# Patient Record
Sex: Male | Born: 1985 | Race: White | Hispanic: No | Marital: Married | Smoking: Current every day smoker
Health system: Southern US, Community
[De-identification: ages and names within clinical notes are randomized; demographics above are authoritative.]

## PROBLEM LIST (undated history)

## (undated) DIAGNOSIS — I10 Essential (primary) hypertension: Secondary | ICD-10-CM

## (undated) HISTORY — PX: APPENDECTOMY: SHX54

## (undated) HISTORY — PX: WISDOM TOOTH EXTRACTION: SHX21

---

## 2014-11-12 ENCOUNTER — Encounter (HOSPITAL_COMMUNITY): Payer: Self-pay | Admitting: Vascular Surgery

## 2014-11-12 ENCOUNTER — Emergency Department (HOSPITAL_COMMUNITY)
Admission: EM | Admit: 2014-11-12 | Discharge: 2014-11-12 | Disposition: A | Discharge status: Home / Self Care | Payer: BLUE CROSS/BLUE SHIELD | Attending: Emergency Medicine | Admitting: Emergency Medicine

## 2014-11-12 ENCOUNTER — Emergency Department (HOSPITAL_COMMUNITY): Payer: BLUE CROSS/BLUE SHIELD

## 2014-11-12 DIAGNOSIS — R079 Chest pain, unspecified: Secondary | ICD-10-CM | POA: Diagnosis present

## 2014-11-12 DIAGNOSIS — Z720 Tobacco use: Secondary | ICD-10-CM | POA: Diagnosis not present

## 2014-11-12 DIAGNOSIS — R0789 Other chest pain: Secondary | ICD-10-CM | POA: Diagnosis not present

## 2014-11-12 DIAGNOSIS — I10 Essential (primary) hypertension: Secondary | ICD-10-CM | POA: Diagnosis not present

## 2014-11-12 HISTORY — DX: Essential (primary) hypertension: I10

## 2014-11-12 LAB — CBC
HEMATOCRIT: 43.2 % (ref 39.0–52.0)
HEMOGLOBIN: 14.6 g/dL (ref 13.0–17.0)
MCH: 29.4 pg (ref 26.0–34.0)
MCHC: 33.8 g/dL (ref 30.0–36.0)
MCV: 86.9 fL (ref 78.0–100.0)
Platelets: 205 10*3/uL (ref 150–400)
RBC: 4.97 MIL/uL (ref 4.22–5.81)
RDW: 13.4 % (ref 11.5–15.5)
WBC: 8.3 10*3/uL (ref 4.0–10.5)

## 2014-11-12 LAB — BASIC METABOLIC PANEL
ANION GAP: 8 (ref 5–15)
BUN: 7 mg/dL (ref 6–20)
CHLORIDE: 107 mmol/L (ref 101–111)
CO2: 26 mmol/L (ref 22–32)
Calcium: 9.4 mg/dL (ref 8.9–10.3)
Creatinine, Ser: 1 mg/dL (ref 0.61–1.24)
GFR calc non Af Amer: >60 mL/min (ref >60)
GLUCOSE: 117 mg/dL — AB (ref 65–99)
Potassium: 4 mmol/L (ref 3.5–5.1)
Sodium: 141 mmol/L (ref 135–145)

## 2014-11-12 LAB — I-STAT TROPONIN, ED
Troponin i, poc: 0 ng/mL (ref 0.00–0.08)
Troponin i, poc: 0 ng/mL (ref 0.00–0.08)

## 2014-11-12 NOTE — ED Provider Notes (Signed)
CSN: [id number]     Arrival date & time 11/12/14  1240 History  This chart was scribed for Eyvonne Mechanic, PA-C, working with Vanetta Mulders, MD by Elon Spanner, ED Scribe. This patient was seen in room TR06C/TR06C and the patient's care was started at 5:24 PM.   Chief Complaint  Patient presents with  . Chest Pain   The history is provided by the patient. No language interpreter was used.   HPI Comments: Jason Frank is a 29 y.o. male who presents to the Emergency Department complaining of sharp/pressure, non-radiating left-sided CP only with deep inspiration, cough, and movement onset two days ago with no precipitating event.  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  He has taken Aleve without relief. No SOB.  Patient is a current smoker.  He denies recent surgery, prolonged immobilzation or hx of HTN, HLD, CA.   There is a family hx of MI in father (4 total, the first at age 53).  He denies new cough, fever, rhinorrhea, eye watering.   Pt believes he pulled a muscle in his chest and would like work not so that he can rest.   PCP: Dr. Dimas Aguas  Past Medical History  Diagnosis Date  . Hypertension    Past Surgical History  Procedure Laterality Date  . Appendectomy    . Wisdom tooth extraction     No family history on file. Social History  Substance Use Topics  . Smoking status: Current Some Day Smoker -- 1.00 packs/day    Types: Cigarettes  . Smokeless tobacco: Current User    Types: Chew  . Alcohol Use: Yes     Comment: occasioanlly    Review of Systems  All other systems reviewed and are negative.  Allergies  Review of patient's allergies indicates not on file.  Home Medications   Prior to Admission medications   Not on File   BP 132/82 mmHg  Pulse 79  Temp(Src) 98.2 F (36.8 C) (Oral)  Resp 12  SpO2 100% Physical Exam  Constitutional: He is oriented to person, place, and time. He appears well-developed and well-nourished. No  distress.  HENT:  Head: Normocephalic and atraumatic.  Eyes: Conjunctivae and EOM are normal.  Neck: Neck supple. No tracheal deviation present.  Cardiovascular: Normal rate, regular rhythm, normal heart sounds and intact distal pulses.  Exam reveals no gallop and no friction rub.   No murmur heard. Pulmonary/Chest: Effort normal. No respiratory distress.  Lungs CTA.   Exquisitely tender over left superior portion of pectoralis major.  No signs of trauma, bruising.  No rash.   Musculoskeletal: Normal range of motion. He exhibits no edema or tenderness.  No LE swelling or edema  Neurological: He is alert and oriented to person, place, and time.  Skin: Skin is warm and dry.  Psychiatric: He has a normal mood and affect. His behavior is normal.  Nursing note and vitals reviewed.   ED Course  Procedures (including critical care time)  DIAGNOSTIC STUDIES: Oxygen Saturation is 98% on RA, normal by my interpretation.    COORDINATION OF CARE:  5:36 PM Patient should use ibuprofen and f/u with PCP if no improvement observed.  Patient acknowledges and agrees with plan.    Labs Review Labs Reviewed  BASIC METABOLIC PANEL - Abnormal; Notable for the following:    Glucose, Bld 117 (*)    All other components within normal limits  CBC  I-STAT TROPOININ, ED  Rosezena Sensor, ED  Imaging Review No results found. I have personally reviewed and evaluated these images and lab results as part of my medical decision-making.   EKG Interpretation   Date/Time:  Monday November 12 2014 12:46:44 EDT Ventricular Rate:  95 PR Interval:  152 QRS Duration: 98 QT Interval:  332 QTC Calculation: 417 R Axis:   94 Text Interpretation:  Normal sinus rhythm Rightward axis Incomplete right  bundle branch block Borderline ECG No old tracing to compare Confirmed by  Rhunette Croft, MD, Janey Genta [phone]) on 11/12/2014 3:04:41 PM      MDM   Final diagnoses:  Chest wall tenderness      Labs: I-stat  Troponin (x2), CBC, Basic Metabolic Panel  Imaging: DG Chest 2 View  Therapeutics: none  Assessment/Plan: Pt's presentation likely represents muscular or costochondritis pain. Perc negative, ACS hear score 1; highly unlikely to be PE/ACS. Chest x ray shows no signs of pneumonia. No UR symptoms. Pt would like work note until his chest pain improves. No findings on exam that would indicate significant injury. He will be given work note for tonight and instructed to follow-up with PCP for further evaluation and management. Strict return precautions given.   Discharge Meds: Ibuprofen   I personally performed the services described in this documentation, which was scribed in my presence. The recorded information has been reviewed and is accurate.   Eyvonne Mechanic, PA-C 11/14/14 1426  Vanetta Mulders, MD 11/16/14 [phone]

## 2014-11-12 NOTE — ED Notes (Signed)
Pt reports to the ED for eval of left sided CP x several days. Reports the pain is worse with deep inspiration, movement, and cough. He reports associated symptoms of SOB and dizziness with position changes. Pt denies any injury to chest or cough. Describes the pain as sharp in nature. It does not radiate. Pt A&Ox4, resp e/u, and skin warm and dry.

## 2014-11-12 NOTE — Discharge Instructions (Signed)
Chest Wall Pain Chest wall pain is pain in or around the bones and muscles of your chest. It may take up to 6 weeks to get better. It may take longer if you must stay physically active in your work and activities.  CAUSES  Chest wall pain may happen on its own. However, it may be caused by:  A viral illness like the flu.  Injury.  Coughing.  Exercise.  Arthritis.  Fibromyalgia.  Shingles. HOME CARE INSTRUCTIONS   Avoid overtiring physical activity. Try not to strain or perform activities that cause pain. This includes any activities using your chest or your abdominal and side muscles, especially if heavy weights are used.  Put ice on the sore area.  Put ice in a plastic bag.  Place a towel between your skin and the bag.  Leave the ice on for 15-20 minutes per hour while awake for the first 2 days.  Only take over-the-counter or prescription medicines for pain, discomfort, or fever as directed by your caregiver. SEEK IMMEDIATE MEDICAL CARE IF:   Your pain increases, or you are very uncomfortable.  You have a fever.  Your chest pain becomes worse.  You have new, unexplained symptoms.  You have nausea or vomiting.  You feel sweaty or lightheaded.  You have a cough with phlegm (sputum), or you cough up blood. MAKE SURE YOU:   Understand these instructions.  Will watch your condition.  Will get help right away if you are not doing well or get worse. Document Released: 03/09/2005 Document Revised: 06/01/2011 Document Reviewed: 11/03/2010 [facility] Patient Information 2015 [place], [place]. This information is not intended to replace advice given to you by your health care provider. Make sure you discuss any questions you have with your health care provider.  Please use ibuprofen, Tylenol, ice as needed for pain. Please contact your primary care provider for reevaluation of symptoms continue to persist.

## 2014-11-12 NOTE — ED Notes (Signed)
Pt is in stable condition upon d/c and ambulates from ED. 

## 2015-07-24 DIAGNOSIS — Z9852 Vasectomy status: Secondary | ICD-10-CM | POA: Diagnosis not present

## 2015-07-24 DIAGNOSIS — Z302 Encounter for sterilization: Secondary | ICD-10-CM | POA: Diagnosis not present

## 2015-08-07 DIAGNOSIS — Z9852 Vasectomy status: Secondary | ICD-10-CM | POA: Diagnosis not present

## 2015-08-07 DIAGNOSIS — K600 Acute anal fissure: Secondary | ICD-10-CM | POA: Diagnosis not present

## 2015-08-07 DIAGNOSIS — Z6827 Body mass index (BMI) 27.0-27.9, adult: Secondary | ICD-10-CM | POA: Diagnosis not present

## 2016-02-05 DIAGNOSIS — F17200 Nicotine dependence, unspecified, uncomplicated: Secondary | ICD-10-CM | POA: Diagnosis not present

## 2016-02-05 DIAGNOSIS — I10 Essential (primary) hypertension: Secondary | ICD-10-CM | POA: Diagnosis not present

## 2016-02-05 DIAGNOSIS — Z6828 Body mass index (BMI) 28.0-28.9, adult: Secondary | ICD-10-CM | POA: Diagnosis not present

## 2016-02-17 DIAGNOSIS — F17200 Nicotine dependence, unspecified, uncomplicated: Secondary | ICD-10-CM | POA: Diagnosis not present

## 2016-02-17 DIAGNOSIS — I10 Essential (primary) hypertension: Secondary | ICD-10-CM | POA: Diagnosis not present

## 2016-02-26 DIAGNOSIS — I10 Essential (primary) hypertension: Secondary | ICD-10-CM | POA: Diagnosis not present

## 2016-02-26 DIAGNOSIS — K219 Gastro-esophageal reflux disease without esophagitis: Secondary | ICD-10-CM | POA: Diagnosis not present

## 2016-02-26 DIAGNOSIS — Z6827 Body mass index (BMI) 27.0-27.9, adult: Secondary | ICD-10-CM | POA: Diagnosis not present

## 2016-02-26 DIAGNOSIS — Z23 Encounter for immunization: Secondary | ICD-10-CM | POA: Diagnosis not present

## 2016-02-26 DIAGNOSIS — F17200 Nicotine dependence, unspecified, uncomplicated: Secondary | ICD-10-CM | POA: Diagnosis not present

## 2016-04-16 DIAGNOSIS — A084 Viral intestinal infection, unspecified: Secondary | ICD-10-CM | POA: Diagnosis not present

## 2016-04-16 DIAGNOSIS — Z6828 Body mass index (BMI) 28.0-28.9, adult: Secondary | ICD-10-CM | POA: Diagnosis not present

## 2017-03-02 DIAGNOSIS — Z6830 Body mass index (BMI) 30.0-30.9, adult: Secondary | ICD-10-CM | POA: Diagnosis not present

## 2017-03-02 DIAGNOSIS — J209 Acute bronchitis, unspecified: Secondary | ICD-10-CM | POA: Diagnosis not present

## 2017-04-22 DIAGNOSIS — S62639B Displaced fracture of distal phalanx of unspecified finger, initial encounter for open fracture: Secondary | ICD-10-CM | POA: Insufficient documentation

## 2017-06-21 DIAGNOSIS — Z6826 Body mass index (BMI) 26.0-26.9, adult: Secondary | ICD-10-CM | POA: Diagnosis not present

## 2017-06-21 DIAGNOSIS — K649 Unspecified hemorrhoids: Secondary | ICD-10-CM | POA: Diagnosis not present

## 2017-09-04 DIAGNOSIS — Z6830 Body mass index (BMI) 30.0-30.9, adult: Secondary | ICD-10-CM | POA: Diagnosis not present

## 2017-09-04 DIAGNOSIS — R197 Diarrhea, unspecified: Secondary | ICD-10-CM | POA: Diagnosis not present

## 2017-09-04 DIAGNOSIS — J019 Acute sinusitis, unspecified: Secondary | ICD-10-CM | POA: Diagnosis not present

## 2019-03-26 DIAGNOSIS — Z20822 Contact with and (suspected) exposure to covid-19: Secondary | ICD-10-CM | POA: Diagnosis not present

## 2019-03-26 DIAGNOSIS — R05 Cough: Secondary | ICD-10-CM | POA: Diagnosis not present

## 2019-03-26 DIAGNOSIS — R0989 Other specified symptoms and signs involving the circulatory and respiratory systems: Secondary | ICD-10-CM | POA: Diagnosis not present

## 2019-03-26 DIAGNOSIS — R0981 Nasal congestion: Secondary | ICD-10-CM | POA: Diagnosis not present

## 2019-06-13 ENCOUNTER — Ambulatory Visit (INDEPENDENT_AMBULATORY_CARE_PROVIDER_SITE_OTHER): Payer: BC Managed Care – PPO | Admitting: Sports Medicine

## 2019-06-13 ENCOUNTER — Ambulatory Visit (INDEPENDENT_AMBULATORY_CARE_PROVIDER_SITE_OTHER): Payer: BC Managed Care – PPO

## 2019-06-13 ENCOUNTER — Other Ambulatory Visit: Payer: Self-pay

## 2019-06-13 DIAGNOSIS — S6991XA Unspecified injury of right wrist, hand and finger(s), initial encounter: Secondary | ICD-10-CM

## 2019-06-13 DIAGNOSIS — S62021A Displaced fracture of middle third of navicular [scaphoid] bone of right wrist, initial encounter for closed fracture: Secondary | ICD-10-CM

## 2019-06-13 DIAGNOSIS — S62009A Unspecified fracture of navicular [scaphoid] bone of unspecified wrist, initial encounter for closed fracture: Secondary | ICD-10-CM | POA: Insufficient documentation

## 2019-06-13 DIAGNOSIS — R030 Elevated blood-pressure reading, without diagnosis of hypertension: Secondary | ICD-10-CM | POA: Diagnosis not present

## 2019-06-13 DIAGNOSIS — S62001A Unspecified fracture of navicular [scaphoid] bone of right wrist, initial encounter for closed fracture: Secondary | ICD-10-CM | POA: Diagnosis not present

## 2019-06-13 MED ORDER — MELOXICAM 15 MG PO TABS
ORAL_TABLET | ORAL | 3 refills | Status: DC
Start: 2019-06-13 — End: 2023-03-07

## 2019-06-13 NOTE — Assessment & Plan Note (Addendum)
Jason Frank had a fall onto an outstretched hand about 2 months ago, he had significant swelling and bruising at the time but never sought medical care. He has persistent pain, swelling and loss of range of motion. Pain in the anatomical snuffbox, and a positive Watson's test and a positive lunotriquetral shuck test. We are going to place him in a thumb spica brace, start meloxicam, and get some x-rays. Return in 2 weeks, if no better we will proceed with MR arthrogram. He will need to wear his brace while working for now.  X-rays do show a displaced scaphoid fracture. This is unfortunately going to need surgery.  I am going to go ahead and do a referral to Dr. Amanda Pea with orthopedic hand surgery, follow with me as needed.

## 2019-06-13 NOTE — Addendum Note (Signed)
Addended by: Monica Becton on: 06/13/2019 11:43 AM   Modules accepted: Orders

## 2019-06-13 NOTE — Progress Notes (Addendum)
    Procedures performed today:    None.  Independent interpretation of notes and tests performed by another provider:   None.  Impression and Recommendations:    Closed displaced fracture of scaphoid Jason Frank had a fall onto an outstretched hand about 2 months ago, he had significant swelling and bruising at the time but never sought medical care. He has persistent pain, swelling and loss of range of motion. Pain in the anatomical snuffbox, and a positive Watson's test and a positive lunotriquetral shuck test. We are going to place him in a thumb spica brace, start meloxicam, and get some x-rays. Return in 2 weeks, if no better we will proceed with MR arthrogram. He will need to wear his brace while working for now.  X-rays do show a displaced scaphoid fracture. This is unfortunately going to need surgery.  I am going to go ahead and do a referral to Dr. Amanda Pea with orthopedic hand surgery, follow with me as needed.  Elevated blood pressure reading No headaches or visual changes or chest pain. He does have a strong family history of MI. He agrees to seek primary care ASAP.    ___________________________________________ Jason Frank. Benjamin Stain, M.D., ABFM., CAQSM. Primary Care and Sports Medicine [facility]  Adjunct Instructor of Family Medicine  [employer]

## 2019-06-13 NOTE — Assessment & Plan Note (Signed)
No headaches or visual changes or chest pain. He does have a strong family history of MI. He agrees to seek primary care ASAP.

## 2019-06-16 DIAGNOSIS — S6291XA Unspecified fracture of right wrist and hand, initial encounter for closed fracture: Secondary | ICD-10-CM | POA: Diagnosis not present

## 2019-06-20 ENCOUNTER — Other Ambulatory Visit: Payer: Self-pay | Admitting: Orthopedic Surgery

## 2019-06-20 DIAGNOSIS — S6291XA Unspecified fracture of right wrist and hand, initial encounter for closed fracture: Secondary | ICD-10-CM

## 2019-06-27 ENCOUNTER — Ambulatory Visit: Payer: BC Managed Care – PPO | Admitting: Sports Medicine

## 2019-07-04 ENCOUNTER — Other Ambulatory Visit: Payer: Self-pay | Admitting: Orthopedic Surgery

## 2019-07-05 ENCOUNTER — Other Ambulatory Visit: Payer: Self-pay | Admitting: Orthopedic Surgery

## 2019-07-05 DIAGNOSIS — S6291XA Unspecified fracture of right wrist and hand, initial encounter for closed fracture: Secondary | ICD-10-CM

## 2019-07-06 ENCOUNTER — Other Ambulatory Visit: Payer: Self-pay

## 2019-07-06 ENCOUNTER — Ambulatory Visit
Admission: RE | Admit: 2019-07-06 | Discharge: 2019-07-06 | Disposition: A | Discharge status: Home / Self Care | Payer: BC Managed Care – PPO | Source: Ambulatory Visit | Attending: Orthopedic Surgery | Admitting: Orthopedic Surgery

## 2019-07-06 ENCOUNTER — Inpatient Hospital Stay: Admission: RE | Admit: 2019-07-06 | Payer: BC Managed Care – PPO | Source: Ambulatory Visit

## 2019-07-06 DIAGNOSIS — S6291XA Unspecified fracture of right wrist and hand, initial encounter for closed fracture: Secondary | ICD-10-CM

## 2019-07-06 DIAGNOSIS — S62001A Unspecified fracture of navicular [scaphoid] bone of right wrist, initial encounter for closed fracture: Secondary | ICD-10-CM | POA: Diagnosis not present

## 2019-07-06 DIAGNOSIS — S62184A Nondisplaced fracture of trapezoid [smaller multangular], right wrist, initial encounter for closed fracture: Secondary | ICD-10-CM | POA: Diagnosis not present

## 2019-07-11 DIAGNOSIS — S62001D Unspecified fracture of navicular [scaphoid] bone of right wrist, subsequent encounter for fracture with routine healing: Secondary | ICD-10-CM | POA: Diagnosis not present

## 2019-07-11 DIAGNOSIS — S6291XA Unspecified fracture of right wrist and hand, initial encounter for closed fracture: Secondary | ICD-10-CM | POA: Diagnosis not present

## 2019-07-17 DIAGNOSIS — S62021A Displaced fracture of middle third of navicular [scaphoid] bone of right wrist, initial encounter for closed fracture: Secondary | ICD-10-CM | POA: Diagnosis not present

## 2019-07-17 DIAGNOSIS — Y999 Unspecified external cause status: Secondary | ICD-10-CM | POA: Diagnosis not present

## 2019-07-17 DIAGNOSIS — X58XXXA Exposure to other specified factors, initial encounter: Secondary | ICD-10-CM | POA: Diagnosis not present

## 2019-07-17 DIAGNOSIS — S62001A Unspecified fracture of navicular [scaphoid] bone of right wrist, initial encounter for closed fracture: Secondary | ICD-10-CM | POA: Diagnosis not present

## 2019-08-01 DIAGNOSIS — S62021D Displaced fracture of middle third of navicular [scaphoid] bone of right wrist, subsequent encounter for fracture with routine healing: Secondary | ICD-10-CM | POA: Diagnosis not present

## 2019-08-22 DIAGNOSIS — S62021D Displaced fracture of middle third of navicular [scaphoid] bone of right wrist, subsequent encounter for fracture with routine healing: Secondary | ICD-10-CM | POA: Diagnosis not present

## 2019-09-12 DIAGNOSIS — S62021D Displaced fracture of middle third of navicular [scaphoid] bone of right wrist, subsequent encounter for fracture with routine healing: Secondary | ICD-10-CM | POA: Diagnosis not present

## 2019-10-03 DIAGNOSIS — S62021D Displaced fracture of middle third of navicular [scaphoid] bone of right wrist, subsequent encounter for fracture with routine healing: Secondary | ICD-10-CM | POA: Diagnosis not present

## 2020-03-24 DIAGNOSIS — U071 COVID-19: Secondary | ICD-10-CM | POA: Diagnosis not present

## 2020-03-24 DIAGNOSIS — Z20822 Contact with and (suspected) exposure to covid-19: Secondary | ICD-10-CM | POA: Diagnosis not present

## 2021-03-03 DIAGNOSIS — U071 COVID-19: Secondary | ICD-10-CM | POA: Diagnosis not present

## 2021-03-03 DIAGNOSIS — Z20822 Contact with and (suspected) exposure to covid-19: Secondary | ICD-10-CM | POA: Diagnosis not present

## 2021-03-03 DIAGNOSIS — M791 Myalgia, unspecified site: Secondary | ICD-10-CM | POA: Diagnosis not present

## 2021-08-22 DIAGNOSIS — B349 Viral infection, unspecified: Secondary | ICD-10-CM | POA: Diagnosis not present

## 2021-08-22 DIAGNOSIS — M791 Myalgia, unspecified site: Secondary | ICD-10-CM | POA: Diagnosis not present

## 2021-11-16 IMAGING — DX DG WRIST COMPLETE 3+V*R*
4 series · 4 of 4 positions shown · non-contrast
Comparison: None.

CLINICAL DATA: Fell 2 months ago, right wrist injury, pain and
swelling, pain in anatomic snuff box

EXAM:
RIGHT WRIST - COMPLETE 3+ VIEW

[wrist pa]
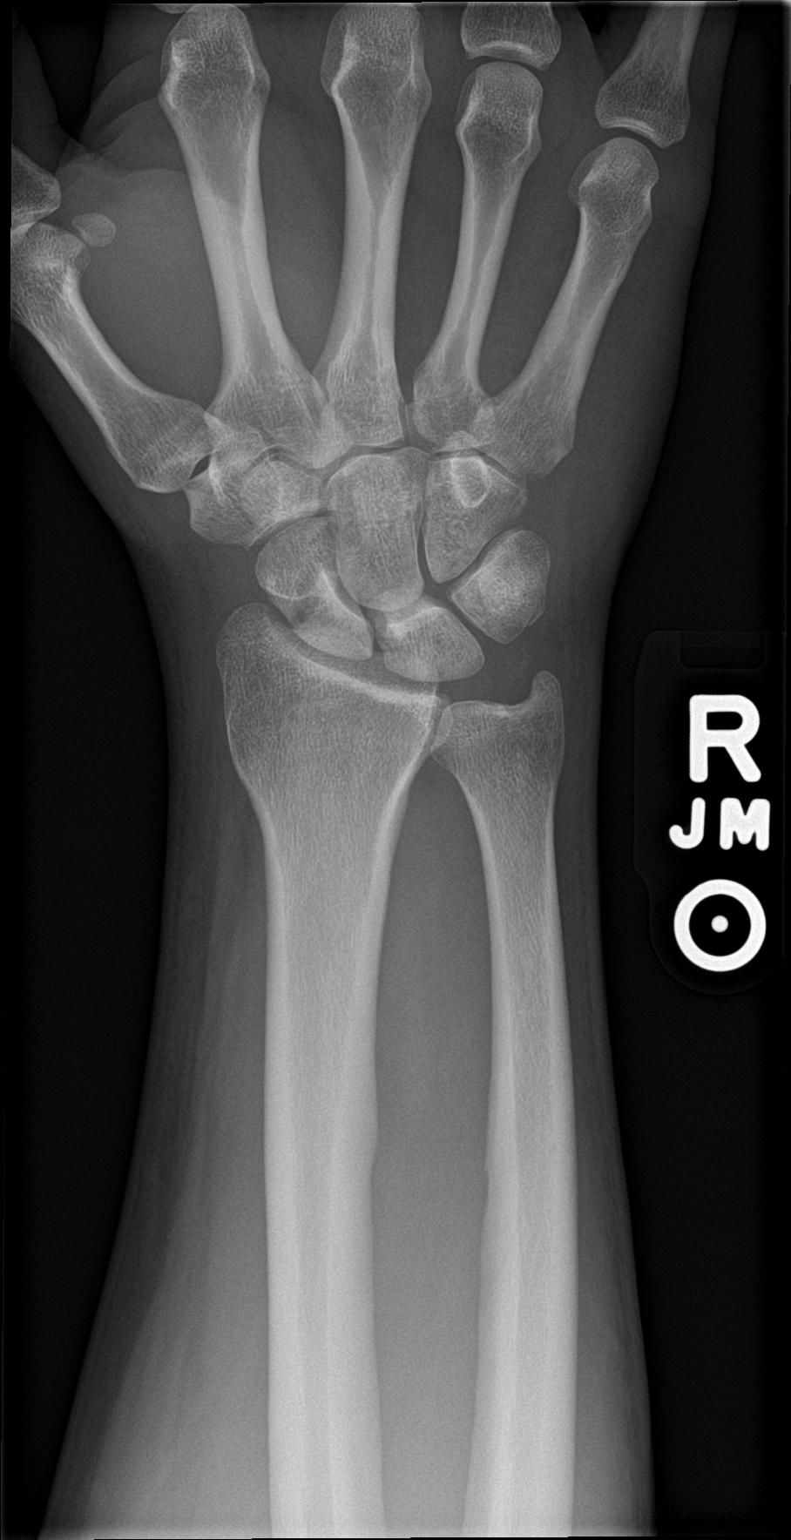

[wrist obl]
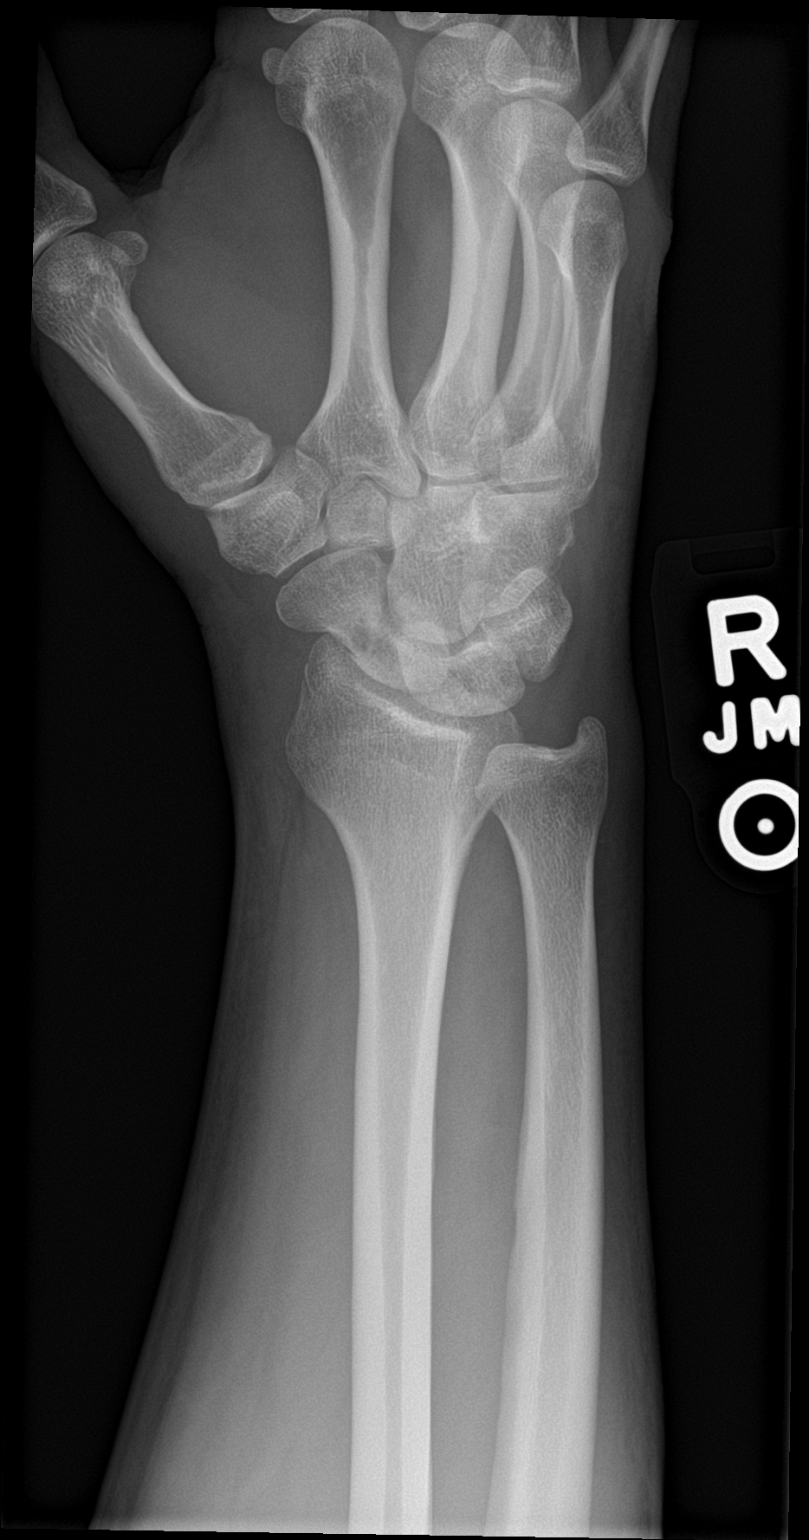

[wrist lat]
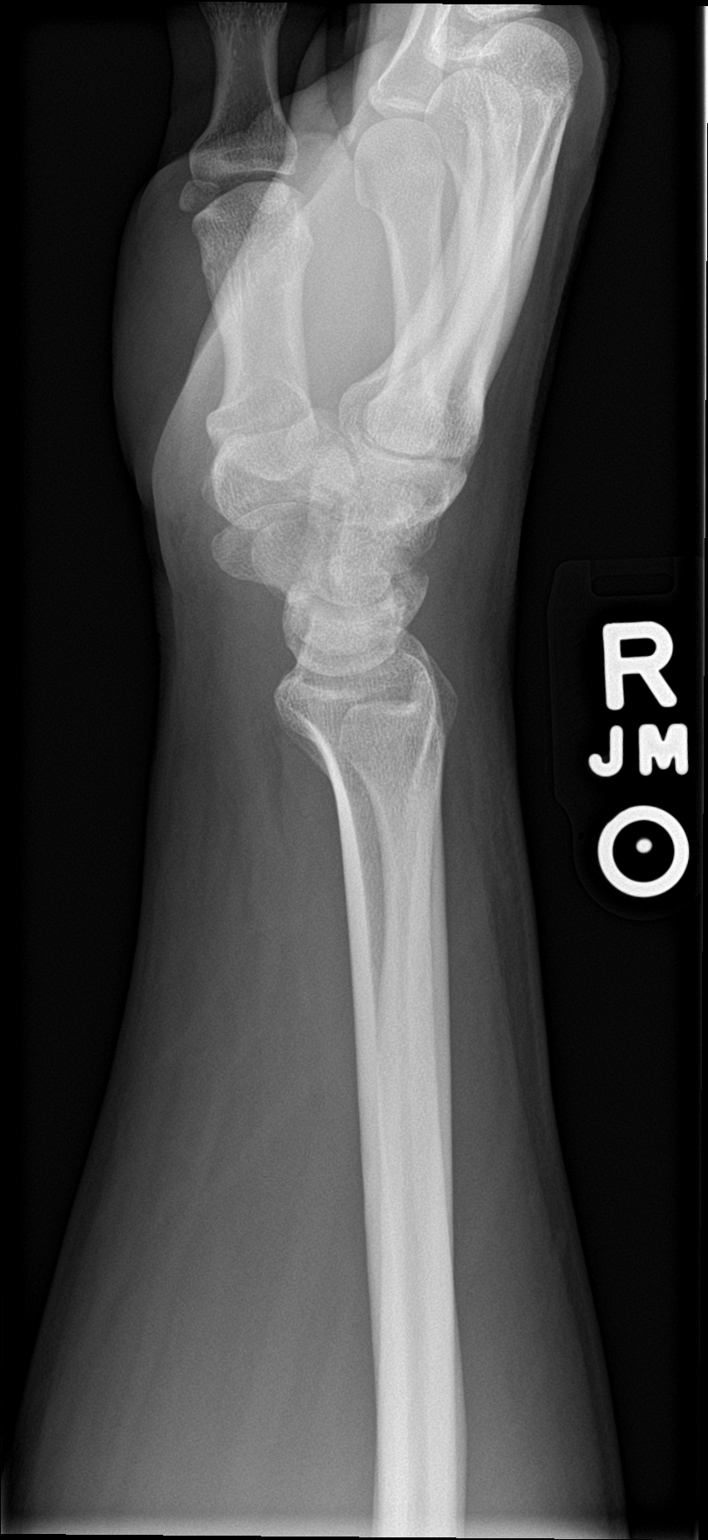

[wrist navicular]
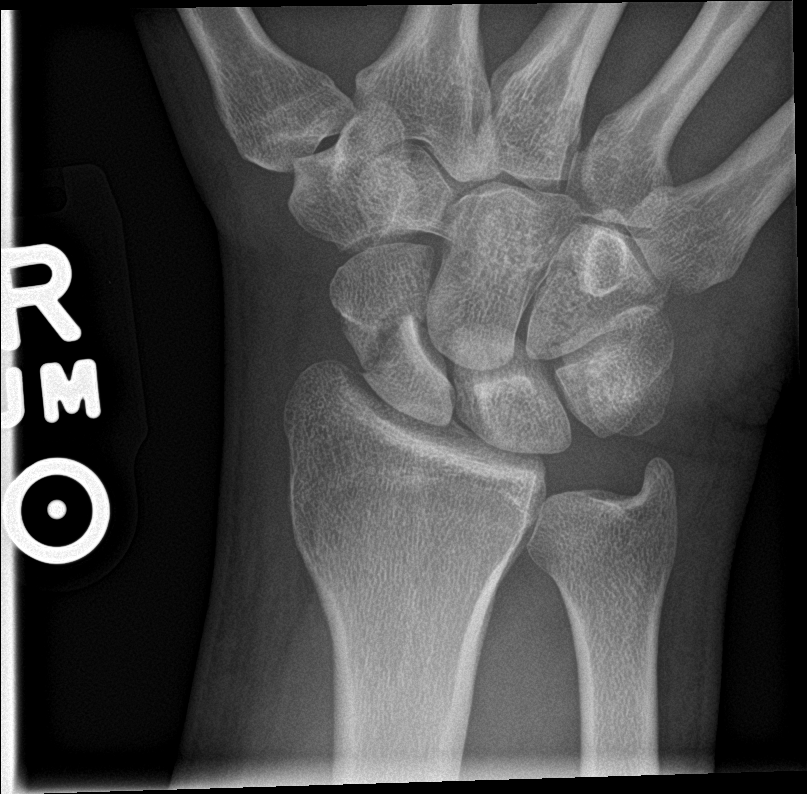

[4 of 4 positions shown; findings below may reference images not displayed]

FINDINGS: Frontal, oblique, lateral, and ulnar deviated views of the right
wrist are obtained. There is a scaphoid waist fracture which is
minimally displaced. No significant callus formation.

No other acute bony abnormalities. Slight negative ulnar variance
incidentally noted. Soft tissues are normal.
IMPRESSION: 1. Minimally displaced scaphoid waist fracture. No significant
callus formation. Orthopedic follow-up recommended.

## 2021-12-09 IMAGING — CT CT WRIST*R* W/O CM
3 of 6 series · 9 of 36 positions shown, 10 images · non-contrast
Comparison: X-ray 06/13/2019

CLINICAL DATA: Scaphoid fracture. Fall 3 months ago

EXAM:
CT OF THE RIGHT WRIST WITHOUT CONTRAST
3-DIMENSIONAL CT IMAGE RENDERING ON INDEPENDENT WORKSTATION
TECHNIQUE: Multidetector CT imaging of the right wrist was performed according
to the standard protocol. Multiplanar CT image reconstructions were
also generated.3-dimensional CT images were rendered by
post-processing of the original CT data on an independent
workstation. The 3-dimensional CT images were interpreted and
findings were reported in the accompanying complete CT report for
this study

[Series 3: wrist 1.50 br60 s3 axial bone hd fov · axial · 0.16mm/px · z∈[-689,-631]mm · 2 of 223 slices shown, 3 images]
[im 75/223  soft-tissue]
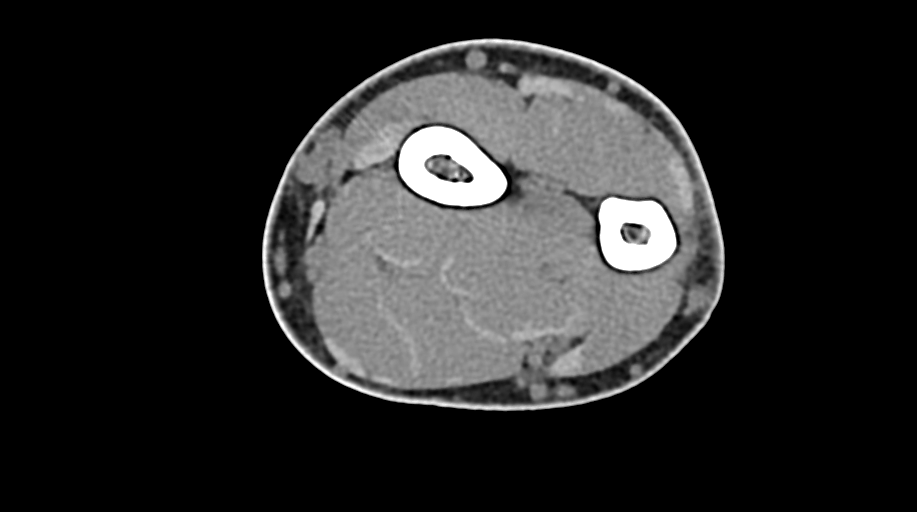
[im 75/223  bone]
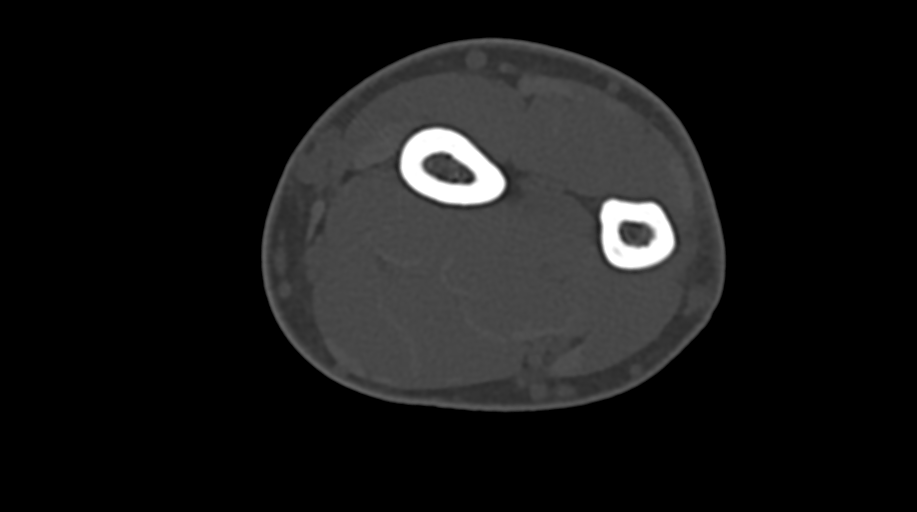
[im 149/223  bone]
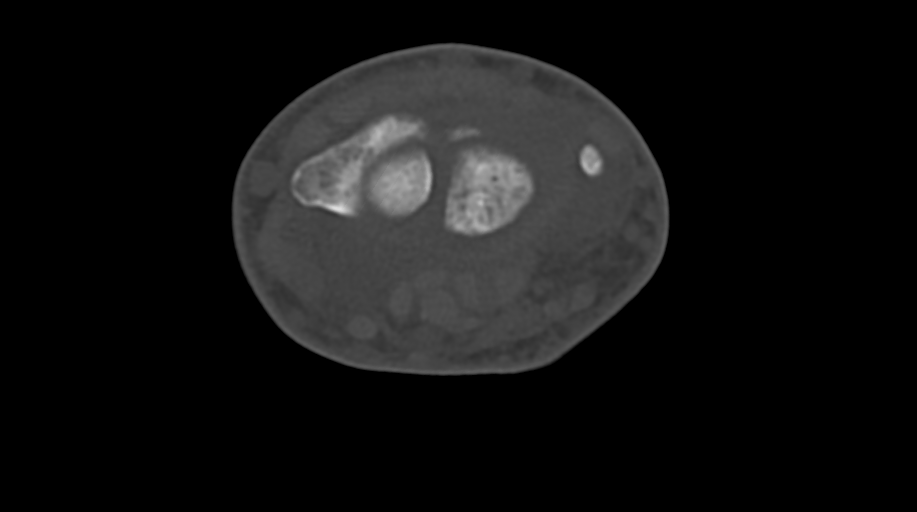

[Series 9: wrist 1.50 br60 s3 cor bone hd fov · coronal · 0.19mm/px · 1 of 112 slices shown]
[im 56/112  bone]
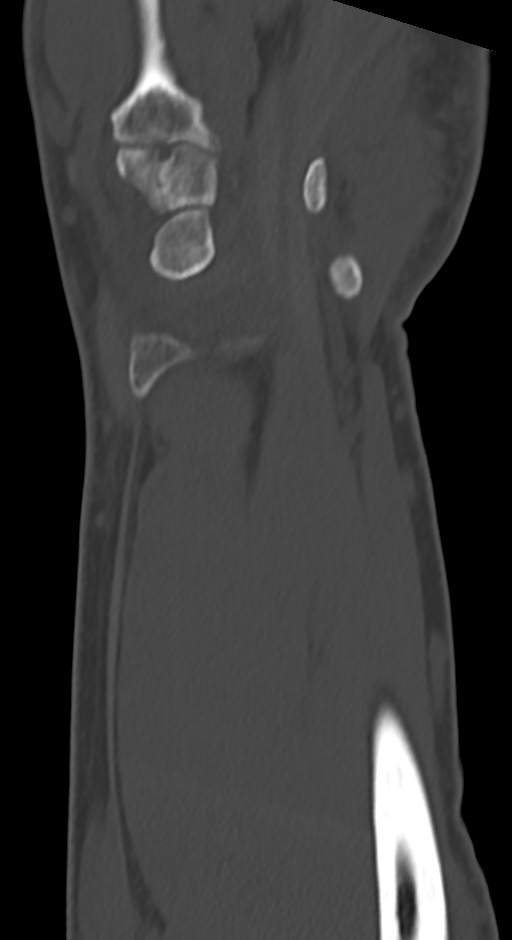

[Series 13: wrist 1.50 br60 s3 sag bone hd fov · sagittal · 0.17mm/px · 6 of 144 slices shown]
[im 24/144  bone]
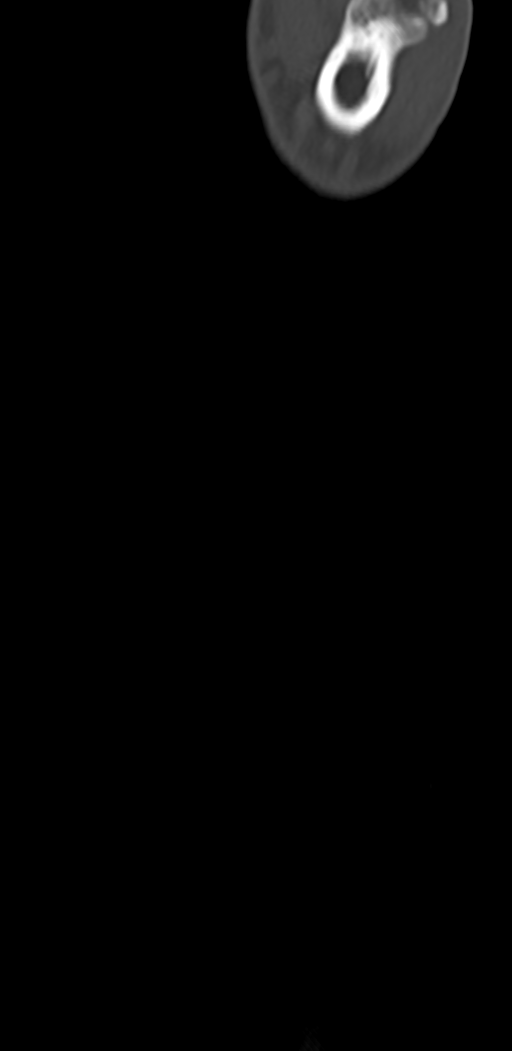
[im 48/144  bone]
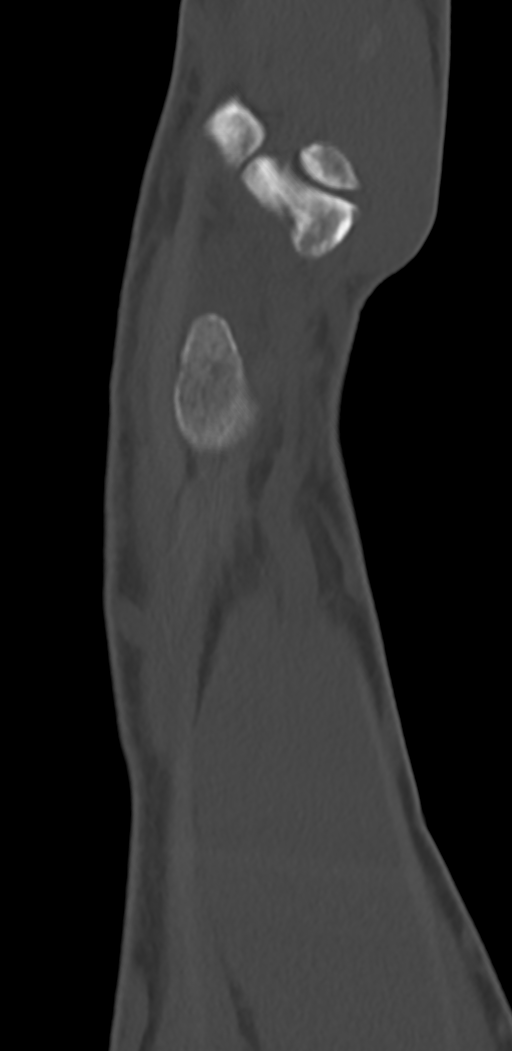
[im 54/144  soft-tissue]
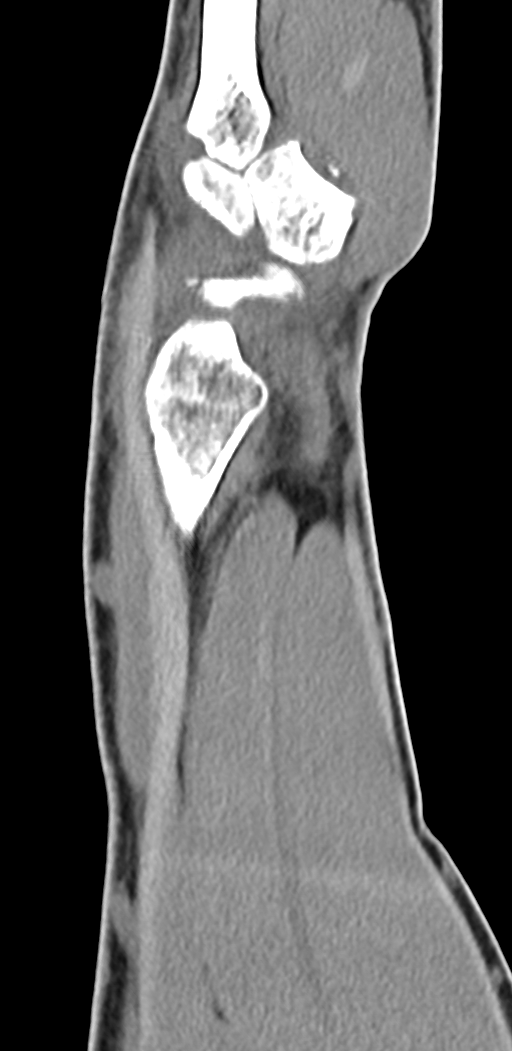
[im 72/144  bone]
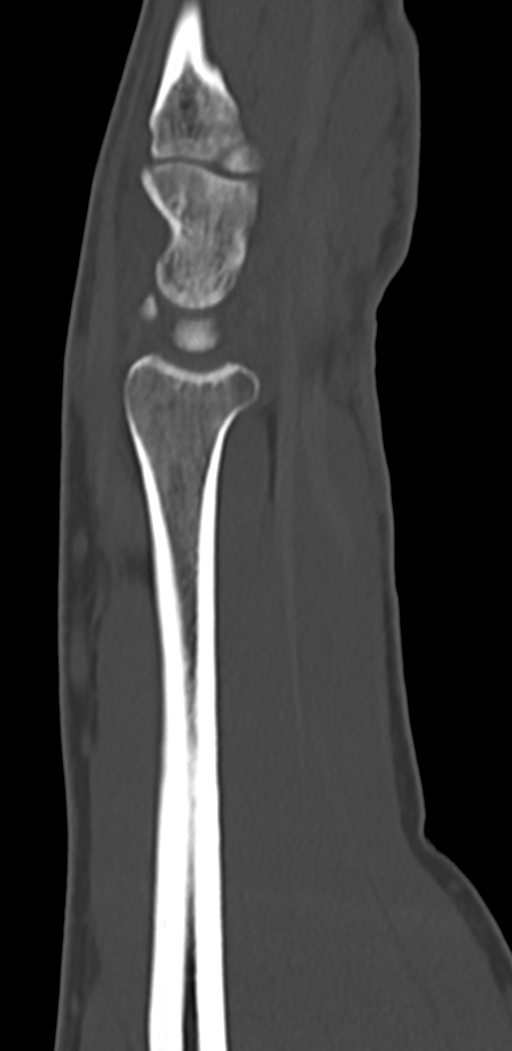
[im 96/144  bone]
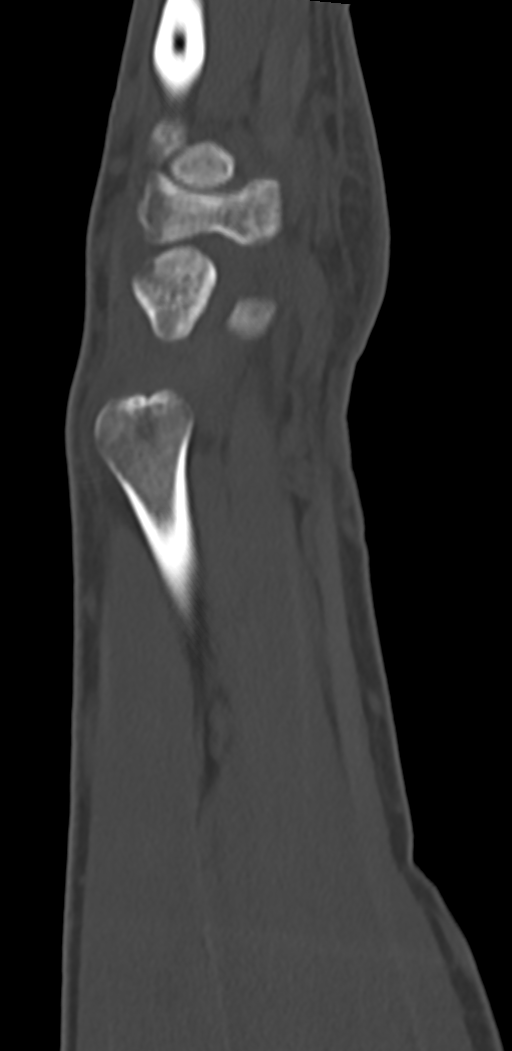
[im 120/144  bone]
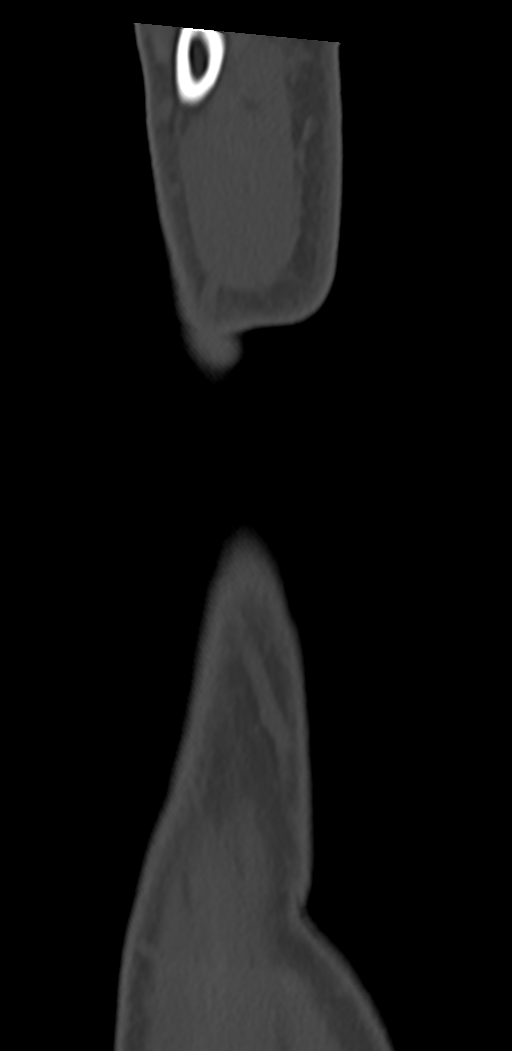

[9 of 36 positions shown; findings below may reference images not displayed]

FINDINGS: Bones/Joint/Cartilage

Scaphoid waist fracture with minimal displacement of 1-2 mm.
Slightly increased sclerosis within the proximal scaphoid pole
relative to the distal. Osseous contour is maintained without
collapse. No bridging callus formation across the fracture site.

Nondisplaced vertically oriented fracture of the trapezoid (series
9, image 59; series 3, image 49) with intra-articular extension to
the second CMC joint and triscaphe joint. Solid bridging bone
formation across the distal 50% of the fracture line (series 3,
image 43).

Remaining carpal bones are intact. Osseous alignment is normal.

Ligaments

Suboptimally assessed by CT.

Muscles and Tendons

No acute musculotendinous abnormality by CT.

Soft tissues

No fluid collection or hematoma.
IMPRESSION: 1. Scaphoid waist fracture with minimal displacement of 1-2 mm
without evidence of interval healing. Slightly increased sclerosis
within the proximal scaphoid pole relative to the distal. Findings
concerning for early osteonecrosis.
2. Nondisplaced vertically oriented fracture of the trapezoid. Solid
bridging bone formation across the distal 50% of the fracture line.

## 2021-12-09 IMAGING — CT CT 3D INDEPENDENT WKST
3 of 6 series · 9 of 36 positions shown, 10 images · non-contrast
Comparison: X-ray 06/13/2019

CLINICAL DATA: Scaphoid fracture. Fall 3 months ago

EXAM:
CT OF THE RIGHT WRIST WITHOUT CONTRAST
3-DIMENSIONAL CT IMAGE RENDERING ON INDEPENDENT WORKSTATION
TECHNIQUE: Multidetector CT imaging of the right wrist was performed according
to the standard protocol. Multiplanar CT image reconstructions were
also generated.3-dimensional CT images were rendered by
post-processing of the original CT data on an independent
workstation. The 3-dimensional CT images were interpreted and
findings were reported in the accompanying complete CT report for
this study

[Series 3: wrist 1.50 br60 s3 axial bone hd fov · axial · 0.16mm/px · z∈[-689,-631]mm · 2 of 223 slices shown, 3 images]
[im 75/223  soft-tissue]
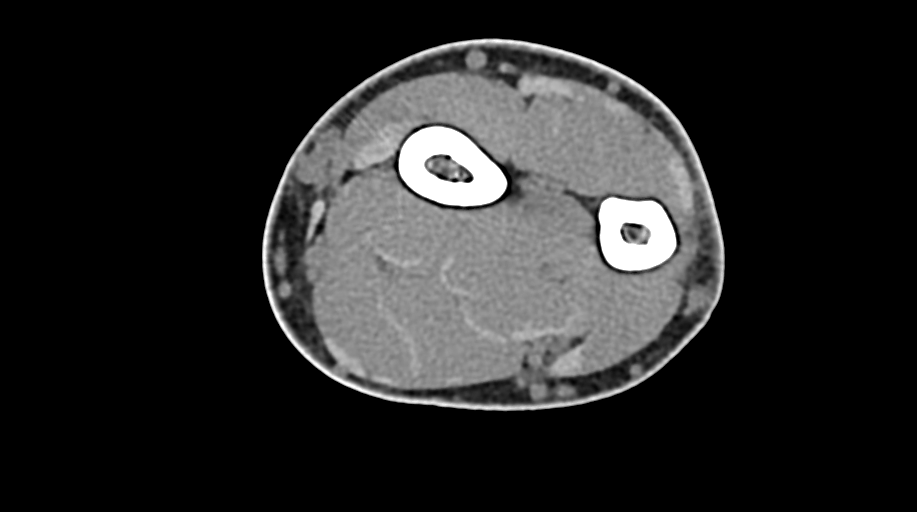
[im 75/223  bone]
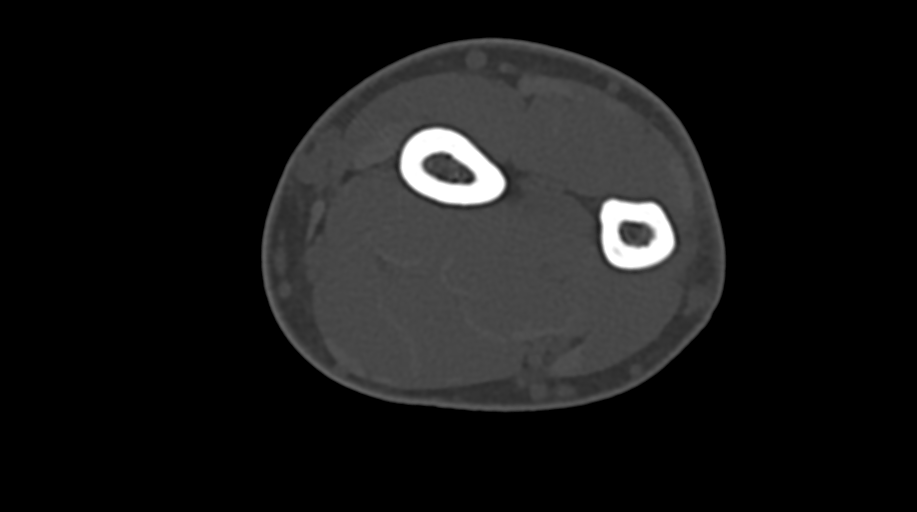
[im 149/223  bone]
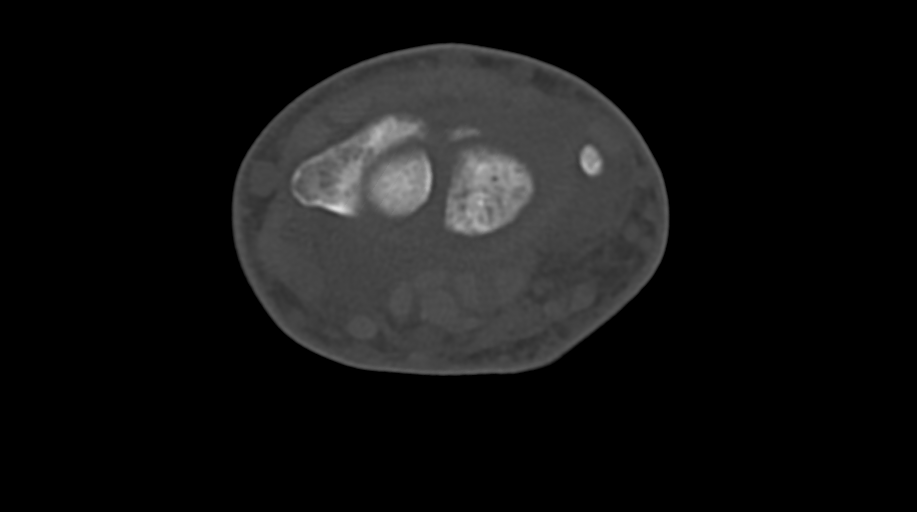

[Series 9: wrist 1.50 br60 s3 cor bone hd fov · coronal · 0.19mm/px · 1 of 112 slices shown]
[im 56/112  bone]
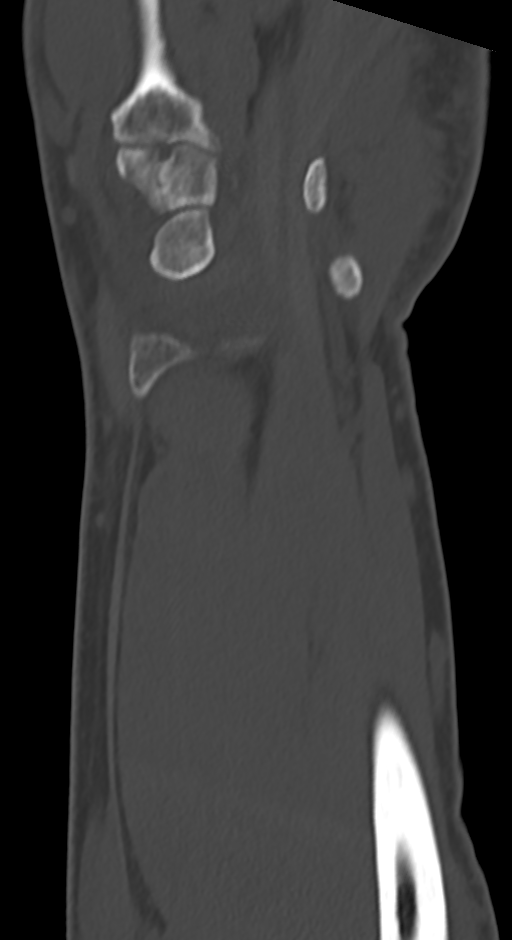

[Series 13: wrist 1.50 br60 s3 sag bone hd fov · sagittal · 0.17mm/px · 6 of 144 slices shown]
[im 24/144  bone]
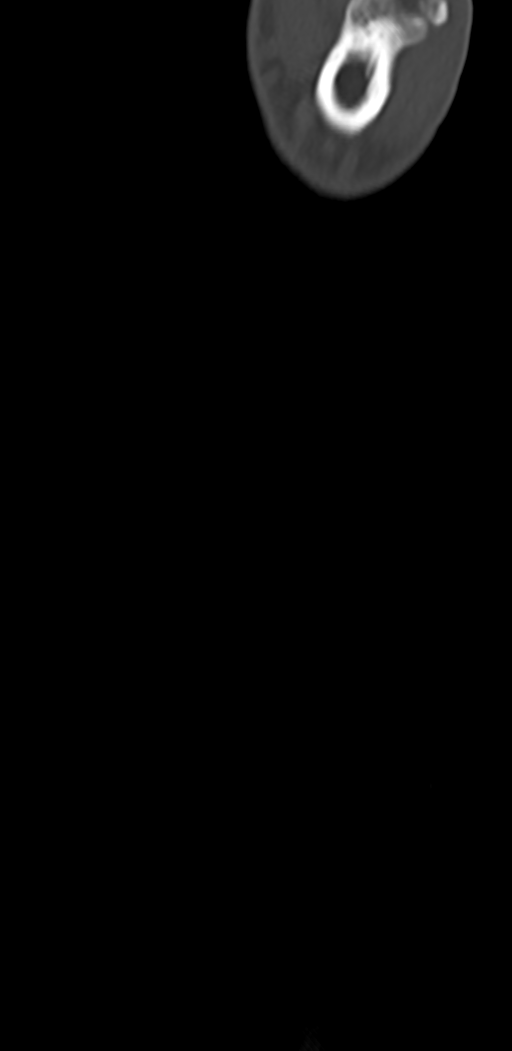
[im 48/144  bone]
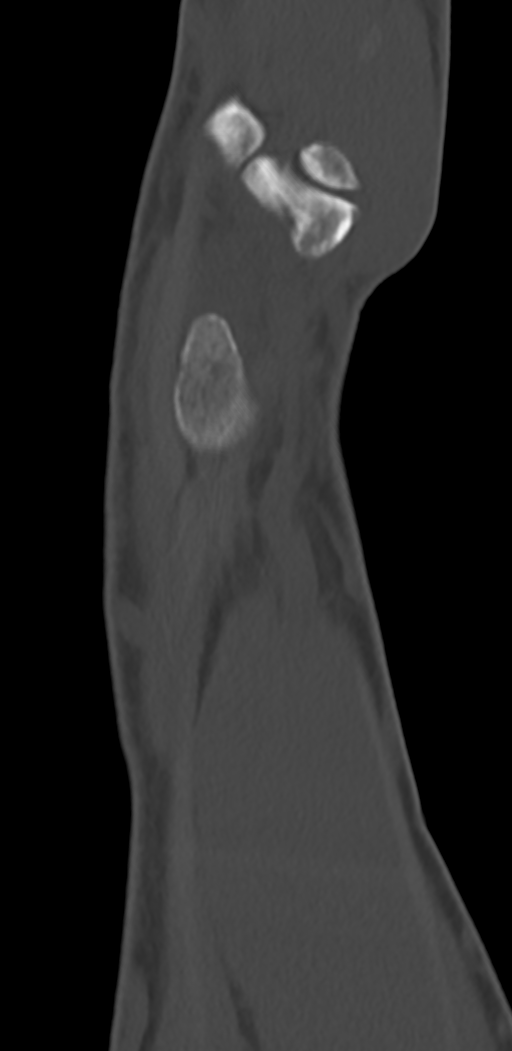
[im 54/144  soft-tissue]
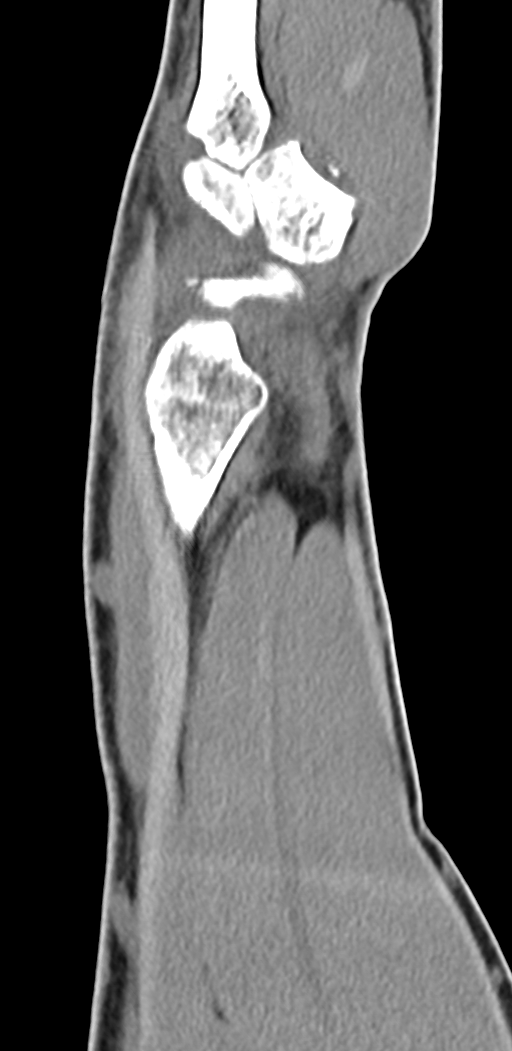
[im 72/144  bone]
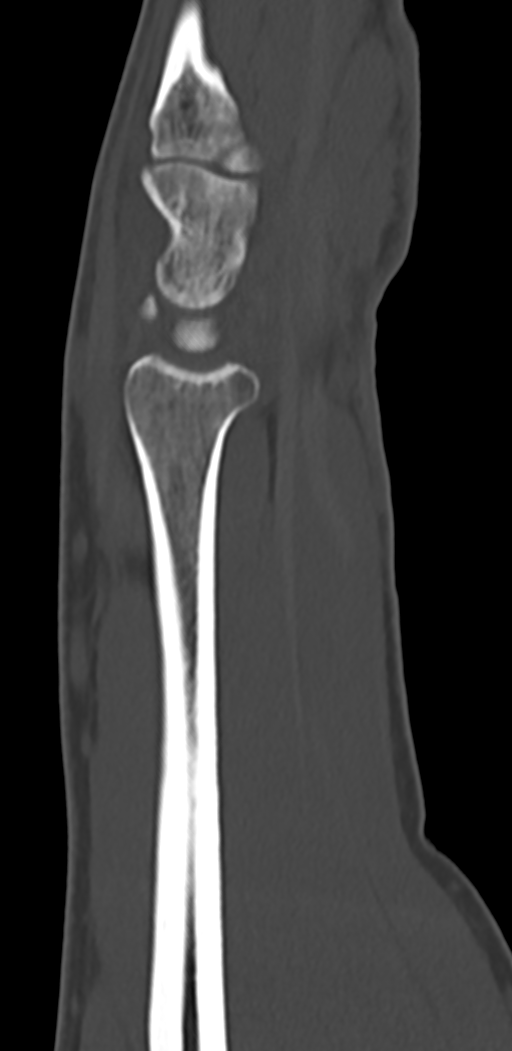
[im 96/144  bone]
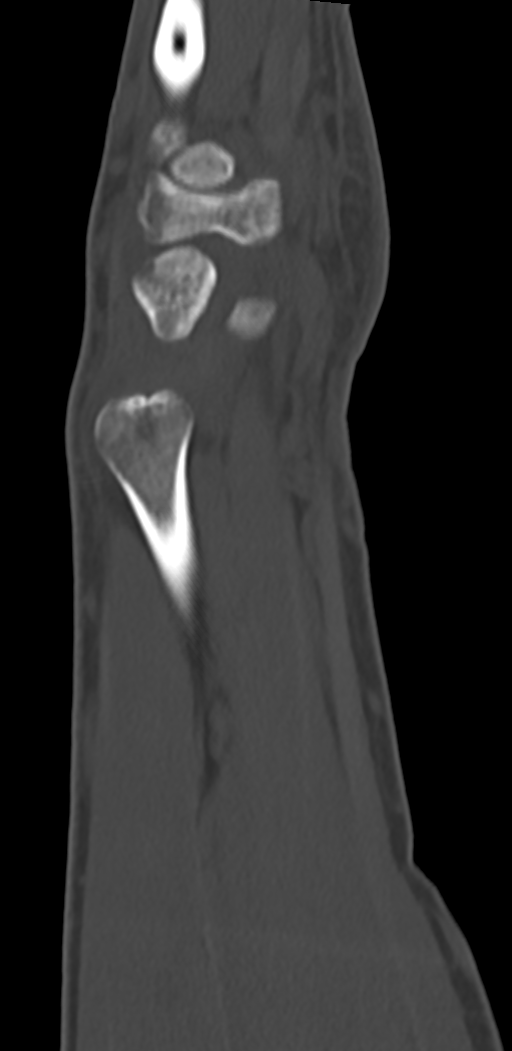
[im 120/144  bone]
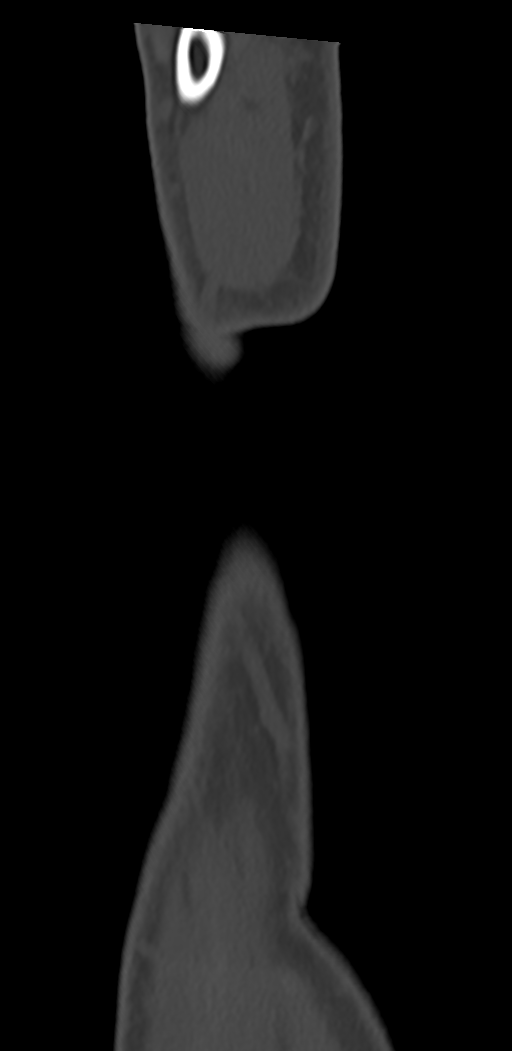

[9 of 36 positions shown; findings below may reference images not displayed]

FINDINGS: Bones/Joint/Cartilage

Scaphoid waist fracture with minimal displacement of 1-2 mm.
Slightly increased sclerosis within the proximal scaphoid pole
relative to the distal. Osseous contour is maintained without
collapse. No bridging callus formation across the fracture site.

Nondisplaced vertically oriented fracture of the trapezoid (series
9, image 59; series 3, image 49) with intra-articular extension to
the second CMC joint and triscaphe joint. Solid bridging bone
formation across the distal 50% of the fracture line (series 3,
image 43).

Remaining carpal bones are intact. Osseous alignment is normal.

Ligaments

Suboptimally assessed by CT.

Muscles and Tendons

No acute musculotendinous abnormality by CT.

Soft tissues

No fluid collection or hematoma.
IMPRESSION: 1. Scaphoid waist fracture with minimal displacement of 1-2 mm
without evidence of interval healing. Slightly increased sclerosis
within the proximal scaphoid pole relative to the distal. Findings
concerning for early osteonecrosis.
2. Nondisplaced vertically oriented fracture of the trapezoid. Solid
bridging bone formation across the distal 50% of the fracture line.

## 2022-02-24 DIAGNOSIS — R070 Pain in throat: Secondary | ICD-10-CM | POA: Diagnosis not present

## 2022-02-24 DIAGNOSIS — Z20822 Contact with and (suspected) exposure to covid-19: Secondary | ICD-10-CM | POA: Diagnosis not present

## 2022-02-24 DIAGNOSIS — J209 Acute bronchitis, unspecified: Secondary | ICD-10-CM | POA: Diagnosis not present

## 2022-02-24 DIAGNOSIS — R059 Cough, unspecified: Secondary | ICD-10-CM | POA: Diagnosis not present

## 2022-07-10 DIAGNOSIS — S62001K Unspecified fracture of navicular [scaphoid] bone of right wrist, subsequent encounter for fracture with nonunion: Secondary | ICD-10-CM | POA: Diagnosis not present

## 2022-07-14 ENCOUNTER — Other Ambulatory Visit: Payer: Self-pay | Admitting: Orthopedic Surgery

## 2022-07-14 ENCOUNTER — Ambulatory Visit
Admission: RE | Admit: 2022-07-14 | Discharge: 2022-07-14 | Disposition: A | Discharge status: Home / Self Care | Payer: BC Managed Care – PPO | Source: Ambulatory Visit | Attending: Orthopedic Surgery | Admitting: Orthopedic Surgery

## 2022-07-14 DIAGNOSIS — M25531 Pain in right wrist: Secondary | ICD-10-CM | POA: Insufficient documentation

## 2022-07-14 DIAGNOSIS — M25431 Effusion, right wrist: Secondary | ICD-10-CM | POA: Diagnosis not present

## 2022-07-14 DIAGNOSIS — S62001A Unspecified fracture of navicular [scaphoid] bone of right wrist, initial encounter for closed fracture: Secondary | ICD-10-CM | POA: Diagnosis not present

## 2022-07-17 DIAGNOSIS — M25531 Pain in right wrist: Secondary | ICD-10-CM | POA: Diagnosis not present

## 2022-07-20 DIAGNOSIS — S62001K Unspecified fracture of navicular [scaphoid] bone of right wrist, subsequent encounter for fracture with nonunion: Secondary | ICD-10-CM | POA: Diagnosis not present

## 2022-07-20 DIAGNOSIS — Z472 Encounter for removal of internal fixation device: Secondary | ICD-10-CM | POA: Diagnosis not present

## 2022-07-31 DIAGNOSIS — Z4789 Encounter for other orthopedic aftercare: Secondary | ICD-10-CM | POA: Diagnosis not present

## 2022-10-15 ENCOUNTER — Encounter: Payer: Self-pay | Admitting: Family Medicine

## 2022-10-15 ENCOUNTER — Ambulatory Visit: Payer: BC Managed Care – PPO | Admitting: Family Medicine

## 2022-10-15 VITALS — BP 155/114 | HR 100 | Resp 16 | Ht 67.0 in | Wt 197.0 lb

## 2022-10-15 DIAGNOSIS — Z7689 Persons encountering health services in other specified circumstances: Secondary | ICD-10-CM

## 2022-10-15 DIAGNOSIS — I10 Essential (primary) hypertension: Secondary | ICD-10-CM

## 2022-10-15 MED ORDER — AMLODIPINE BESYLATE 5 MG PO TABS: 5.0000 mg | ORAL_TABLET | Freq: Every day | ORAL | 1 refills | Status: DC

## 2022-10-15 NOTE — Patient Instructions (Addendum)
Healthy Heart:  Recommend heart healthy/Mediterranean diet, with whole grains, fruits, vegetable, fish, lean meats, nuts, and olive oil. Limit salt. Recommend moderate walking, 3-5 times/week for 30-50 minutes each session. Aim for at least 150 minutes.week. Goal should be pace of 3 miles/hours, or walking 1.5 miles in 30 minutes Recommend avoidance of tobacco products. Avoid excess alcohol.  

## 2022-10-15 NOTE — Progress Notes (Signed)
New Patient Office Visit  Subjective    Patient ID: Jason Frank, male    DOB: 03/19/1986  Age: 37 y.o. MRN: [id number]  CC:  Chief Complaint  Patient presents with   Establish Care    B/p concerns , pt states he been off the medication for about two years .    HPI Jason Frank presents to establish care with this practice. He is new to me. He has diagnosis of hypertension and has not treated for at least 2 years. He was receiving care through [facility]. 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   Hypertension Medication compliance: has been off of his medication for two years. Had a DOT physical and blood pressure was too high.  Denies chest pain, shortness of breath, lower extremity edema, vision changes, headaches.  Pertinent lab work: will get CMP today Monitoring at home: does not monitor at home, will start to monitor  Tolerating medication well: no side effects when taking medication before Continue current medication regimen: amlodipine 5 mg daily  Follow-up: one month   Chart review:  No recent labs Recent DOT physical, unable to view this.    Outpatient Encounter Medications as of 10/15/2022  Medication Sig   amLODipine (NORVASC) 5 MG tablet Take 1 tablet (5 mg total) by mouth daily.   HYDROcodone-acetaminophen (NORCO/VICODIN) 5-325 MG tablet Take 1 tablet by mouth every 8 (eight) hours as needed.   meloxicam (MOBIC) 15 MG tablet One tab PO qAM with a meal for 2 weeks, then daily prn pain.   predniSONE (DELTASONE) 20 MG tablet Take 2 tablets by mouth daily.   [DISCONTINUED] amLODipine (NORVASC) 5 MG tablet Take 1 tablet by mouth daily. (Patient not taking: Reported on 10/15/2022)   No facility-administered encounter medications on file as of 10/15/2022.    Past Medical History:  Diagnosis Date   Hypertension     Past Surgical History:  Procedure Laterality Date   APPENDECTOMY     WISDOM  TOOTH EXTRACTION      Family History  Problem Relation Age of Onset   Diabetes Mother    Hypertension Mother    Hypertension Father    Congestive Heart Failure Father     Social History   Socioeconomic History   Marital status: Married    Spouse name: Not on file   Number of children: Not on file   Years of education: Not on file   Highest education level: Not on file  Occupational History   Not on file  Tobacco Use   Smoking status: Some Days    Current packs/day: 1.00    Types: Cigarettes   Smokeless tobacco: Current    Types: Chew  Substance and Sexual Activity   Alcohol use: Yes    Comment: occasioanlly   Drug use: Yes    Types: Marijuana   Sexual activity: Not on file  Other Topics Concern   Not on file  Social History Narrative   Not on file   Social Determinants of Health   Financial Resource Strain: Not on file  Food Insecurity: Not on file  Transportation Needs: Not on file  Physical Activity: Not on file  Stress: Not on file  Social Connections: Unknown (08/05/2021)   Received from [facility]   Social Network    Social Network: Not on file  Intimate Partner Violence: Unknown (06/27/2021)   Received from [facility],  [facility]   HITS    Physically Hurt: Not on file    Insult or Talk Down To: Not on file    Threaten Physical Harm: Not on file    Scream or Curse: Not on file    Review of Systems  Eyes:  Negative for blurred vision and double vision.  Respiratory:  Negative for shortness of breath.   Cardiovascular:  Negative for chest pain and leg swelling.  Neurological:  Negative for dizziness and headaches.  Psychiatric/Behavioral:  Negative for depression and suicidal ideas. The patient is nervous/anxious (stressed out at work).         Objective    BP (!) 155/114   Pulse 100   Resp 16   Ht 5\' 7"  (1.702 m)   Wt 197 lb (89.4 kg)   SpO2 99%   BMI 30.85 kg/m   Physical Exam Vitals and nursing note reviewed.   Constitutional:      General: He is not in acute distress.    Appearance: Normal appearance. He is obese.  Cardiovascular:     Heart sounds: Normal heart sounds.  Pulmonary:     Breath sounds: Wheezing (smokes one pack per day) present.  Skin:    General: Skin is warm and dry.     Capillary Refill: Capillary refill takes less than 2 seconds.  Neurological:     General: No focal deficit present.     Mental Status: He is alert. Mental status is at baseline.  Psychiatric:        Mood and Affect: Mood normal.        Behavior: Behavior normal.        Thought Content: Thought content normal.        Judgment: Judgment normal.        Assessment & Plan:   Establishing care with new doctor, encounter for  Essential hypertension -     Comprehensive metabolic panel -     amLODIPine Besylate; Take 1 tablet (5 mg total) by mouth daily.  Dispense: 30 tablet; Refill: 1   Will check electrolytes, kidney and liver function today.  Untreated hypertension is not under control.  Denies chest pain, shortness of breath, lower extremity edema, vision changes, headaches.  Start amlodipine 5 mg daily.  Will monitor blood pressure at home and keep log. Bring to follow-up. Agrees with plan of care discussed.  Questions answered.   Return in about 3 weeks (around 11/05/2022) for blood pressure.   Novella Olive, FNP

## 2022-11-05 ENCOUNTER — Ambulatory Visit: Payer: BC Managed Care – PPO | Admitting: Family Medicine

## 2022-11-10 ENCOUNTER — Ambulatory Visit: Payer: BC Managed Care – PPO | Admitting: Family Medicine

## 2022-11-17 ENCOUNTER — Encounter: Payer: Self-pay | Admitting: Family Medicine

## 2022-11-17 ENCOUNTER — Ambulatory Visit: Payer: BC Managed Care – PPO | Admitting: Family Medicine

## 2022-11-17 VITALS — BP 153/103 | HR 80 | Temp 98.4°F | Ht 67.0 in | Wt 197.0 lb

## 2022-11-17 DIAGNOSIS — Z1329 Encounter for screening for other suspected endocrine disorder: Secondary | ICD-10-CM | POA: Insufficient documentation

## 2022-11-17 DIAGNOSIS — I10 Essential (primary) hypertension: Secondary | ICD-10-CM | POA: Insufficient documentation

## 2022-11-17 DIAGNOSIS — Z23 Encounter for immunization: Secondary | ICD-10-CM | POA: Insufficient documentation

## 2022-11-17 MED ORDER — AMLODIPINE BESYLATE 10 MG PO TABS: 10.0000 mg | ORAL_TABLET | Freq: Every day | ORAL | 0 refills | Status: DC

## 2022-11-17 NOTE — Assessment & Plan Note (Signed)
Compliant with amlodipine 5 mg daily as prescribed. Watching sodium, reports eating half of meals at home. Encouraged to be more diligent if eating processed foods. Discussed smoking cessation. Discussed moderate exercise. Denies chest pain, shortness of breath, lower extremity edema, vision changes, headaches. Blood pressure not at goal. Will increase amlodipine to 10 mg daily. Follow-up in 3 weeks, continue to monitor at home. Will add second medication if continues to not be at goal.

## 2022-11-17 NOTE — Progress Notes (Signed)
Established Patient Office Visit  Subjective   Patient ID: Jason Frank, male    DOB: 20-Nov-1985  Age: 37 y.o. MRN: [id number]  Chief Complaint  Patient presents with   Medical Management of Chronic Issues    htn    HPI  Hypertension Medication compliance: taking amlodipine 5 mg daily as prescribed Denies chest pain, shortness of breath, lower extremity edema, vision changes, headaches.  Pertinent lab work: 7/25: CMP normal GFR Monitoring at home: 145/90's, not at goal  Tolerating medication well: no side effects reported  Continue current medication regimen: will increase dose to 10 mg today Follow-up: 3 weeks     Review of Systems  Eyes:  Negative for blurred vision and double vision.  Respiratory:  Negative for shortness of breath.   Cardiovascular:  Negative for chest pain and leg swelling.  Neurological:  Negative for headaches.      Objective:     BP (!) 153/103   Pulse 80   Temp 98.4 F (36.9 C) (Oral)   Ht 5\' 7"  (1.702 m)   Wt 197 lb (89.4 kg)   SpO2 99%   BMI 30.85 kg/m  BP Readings from Last 3 Encounters:  11/17/22 (!) 153/103  10/15/22 (!) 155/114  06/13/19 (!) 159/117      Physical Exam Vitals and nursing note reviewed.  Constitutional:      General: He is not in acute distress.    Appearance: Normal appearance.  Cardiovascular:     Rate and Rhythm: Normal rate and regular rhythm.     Heart sounds: Normal heart sounds.  Pulmonary:     Effort: Pulmonary effort is normal.     Breath sounds: Normal breath sounds.  Skin:    General: Skin is warm and dry.  Neurological:     General: No focal deficit present.     Mental Status: He is alert. Mental status is at baseline.  Psychiatric:        Mood and Affect: Mood normal.        Behavior: Behavior normal.        Thought Content: Thought content normal.        Judgment: Judgment normal.     No results found for any visits on 11/17/22.  Last metabolic panel Lab Results   Component Value Date   GLUCOSE 91 10/15/2022   NA 141 10/15/2022   K 4.1 10/15/2022   CL 103 10/15/2022   CO2 26 10/15/2022   BUN 12 10/15/2022   CREATININE 0.94 10/15/2022   EGFR 106 10/15/2022   CALCIUM 9.7 10/15/2022   PROT 6.9 10/15/2022   ALBUMIN 4.5 10/15/2022   LABGLOB 2.4 10/15/2022   BILITOT 0.3 10/15/2022   ALKPHOS 86 10/15/2022   AST 22 10/15/2022   ALT 14 10/15/2022   ANIONGAP 8 11/12/2014      The ASCVD Risk score (Arnett DK, et al., 2019) failed to calculate for the following reasons:   The 2019 ASCVD risk score is only valid for ages 91 to 63    Assessment & Plan:   Problem List Items Addressed This Visit     Encounter for administration of vaccine    Due for Tdap. Reports fish hook in finger during vacation. Tdap given.  Declines influenza vaccine today.       Relevant Orders   Tdap vaccine greater than or equal to 7yo IM (Completed)   Uncontrolled hypertension - Primary    Compliant with amlodipine 5 mg daily as prescribed. Watching  sodium, reports eating half of meals at home. Encouraged to be more diligent if eating processed foods. Discussed smoking cessation. Discussed moderate exercise. Denies chest pain, shortness of breath, lower extremity edema, vision changes, headaches. Blood pressure not at goal. Will increase amlodipine to 10 mg daily. Follow-up in 3 weeks, continue to monitor at home. Will add second medication if continues to not be at goal.       Relevant Medications   amLODipine (NORVASC) 10 MG tablet  Agrees with plan of care discussed.  Questions answered.   Return in about 3 weeks (around 12/08/2022) for HTN.    Novella Olive, FNP

## 2022-11-17 NOTE — Patient Instructions (Signed)
Healthy Heart:  Recommend heart healthy/Mediterranean diet, with whole grains, fruits, vegetable, fish, lean meats, nuts, and olive oil. Limit salt. Recommend moderate walking, 3-5 times/week for 30-50 minutes each session. Aim for at least 150 minutes.week. Goal should be pace of 3 miles/hours, or walking 1.5 miles in 30 minutes Recommend avoidance of tobacco products. Avoid excess alcohol.  

## 2022-11-17 NOTE — Assessment & Plan Note (Signed)
Due for Tdap. Reports fish hook in finger during vacation. Tdap given.  Declines influenza vaccine today.

## 2022-12-07 ENCOUNTER — Other Ambulatory Visit: Payer: Self-pay | Admitting: Family Medicine

## 2022-12-07 DIAGNOSIS — I10 Essential (primary) hypertension: Secondary | ICD-10-CM

## 2022-12-08 ENCOUNTER — Ambulatory Visit: Payer: BC Managed Care – PPO | Admitting: Family Medicine

## 2022-12-08 ENCOUNTER — Encounter: Payer: Self-pay | Admitting: Family Medicine

## 2022-12-08 VITALS — BP 125/72 | HR 71 | Temp 98.1°F | Resp 18 | Ht 67.0 in | Wt 196.0 lb

## 2022-12-08 DIAGNOSIS — I10 Essential (primary) hypertension: Secondary | ICD-10-CM | POA: Diagnosis not present

## 2022-12-08 MED ORDER — AMLODIPINE BESYLATE 10 MG PO TABS: 10.0000 mg | ORAL_TABLET | Freq: Every day | ORAL | 0 refills | Status: DC

## 2022-12-08 NOTE — Patient Instructions (Signed)
Healthy Heart:  Recommend heart healthy/Mediterranean diet, with whole grains, fruits, vegetable, fish, lean meats, nuts, and olive oil. Limit salt. Recommend moderate walking, 3-5 times/week for 30-50 minutes each session. Aim for at least 150 minutes.week. Goal should be pace of 3 miles/hours, or walking 1.5 miles in 30 minutes Recommend avoidance of tobacco products. Avoid excess alcohol.

## 2022-12-08 NOTE — Progress Notes (Signed)
Established Patient Office Visit  Subjective   Patient ID: Jason Frank, male    DOB: 1985/08/01  Age: 37 y.o. MRN: [id number]  Chief Complaint  Patient presents with   Medical Management of Chronic Issues    Patient is here for a 3 week follow up for HTN    HPI  Hypertension Medication compliance: taking amlodipine 10 mg daily as prescribed.  Denies chest pain, shortness of breath, lower extremity edema, vision changes, headaches.  Pertinent lab work: [facility] 7/25 normal electrolytes, kidney and liver function.  Monitoring at home: 140/90 at home  Tolerating medication well: no side effects Continue current medication regimen: no changes Follow-up: one month for CPE, will assess BP at that time Has started reading labels and identifying high sodium foods.    Review of Systems  Eyes:  Negative for blurred vision and double vision.  Respiratory:  Negative for shortness of breath.   Cardiovascular:  Negative for chest pain and leg swelling.  Neurological:  Negative for headaches.      Objective:     BP 125/72   Pulse 71   Temp 98.1 F (36.7 C) (Oral)   Resp 18   Ht 5\' 7"  (1.702 m)   Wt 196 lb (88.9 kg)   SpO2 97%   BMI 30.70 kg/m  BP Readings from Last 3 Encounters:  12/08/22 125/72  11/17/22 (!) 153/103  10/15/22 (!) 155/114      Physical Exam Vitals and nursing note reviewed.  Constitutional:      General: He is not in acute distress.    Appearance: Normal appearance.  Cardiovascular:     Rate and Rhythm: Regular rhythm.     Heart sounds: Normal heart sounds.  Pulmonary:     Effort: Pulmonary effort is normal.     Breath sounds: Normal breath sounds.  Skin:    General: Skin is warm and dry.  Neurological:     General: No focal deficit present.     Mental Status: He is alert. Mental status is at baseline.  Psychiatric:        Mood and Affect: Mood normal.        Behavior: Behavior normal.        Thought Content: Thought content normal.         Judgment: Judgment normal.     No results found for any visits on 12/08/22.  Last metabolic panel Lab Results  Component Value Date   GLUCOSE 91 10/15/2022   NA 141 10/15/2022   K 4.1 10/15/2022   CL 103 10/15/2022   CO2 26 10/15/2022   BUN 12 10/15/2022   CREATININE 0.94 10/15/2022   EGFR 106 10/15/2022   CALCIUM 9.7 10/15/2022   PROT 6.9 10/15/2022   ALBUMIN 4.5 10/15/2022   LABGLOB 2.4 10/15/2022   BILITOT 0.3 10/15/2022   ALKPHOS 86 10/15/2022   AST 22 10/15/2022   ALT 14 10/15/2022   ANIONGAP 8 11/12/2014      The ASCVD Risk score (Arnett DK, et al., 2019) failed to calculate for the following reasons:   The 2019 ASCVD risk score is only valid for ages 56 to 59    Assessment & Plan:   Problem List Items Addressed This Visit     Essential hypertension - Primary    Taking amlodipine 10 mg daily as prescribed.  Denies chest pain, shortness of breath, lower extremity edema, vision changes, headaches. Watching high sodium foods, continues to use tobacco. Counseled on watching sodium and  smoking cessation. He will return in one month for CPE with labs, will continue to monitor BP as he has been getting higher readings at home. Well controlled in office today. Refill sent.       Relevant Medications   amLODipine (NORVASC) 10 MG tablet  Agrees with plan of care discussed.  Questions answered.   Return in about 1 month (around 01/07/2023) for CPE with labs.    Novella Olive, FNP

## 2022-12-08 NOTE — Assessment & Plan Note (Signed)
Taking amlodipine 10 mg daily as prescribed.  Denies chest pain, shortness of breath, lower extremity edema, vision changes, headaches. Watching high sodium foods, continues to use tobacco. Counseled on watching sodium and smoking cessation. He will return in one month for CPE with labs, will continue to monitor BP as he has been getting higher readings at home. Well controlled in office today. Refill sent.

## 2023-01-19 ENCOUNTER — Ambulatory Visit (INDEPENDENT_AMBULATORY_CARE_PROVIDER_SITE_OTHER): Payer: BC Managed Care – PPO | Admitting: Family Medicine

## 2023-01-19 ENCOUNTER — Encounter: Payer: Self-pay | Admitting: Family Medicine

## 2023-01-19 VITALS — BP 115/77 | HR 62 | Temp 98.9°F | Resp 18 | Ht 67.0 in | Wt 200.2 lb

## 2023-01-19 DIAGNOSIS — Z6831 Body mass index (BMI) 31.0-31.9, adult: Secondary | ICD-10-CM

## 2023-01-19 DIAGNOSIS — Z1329 Encounter for screening for other suspected endocrine disorder: Secondary | ICD-10-CM

## 2023-01-19 DIAGNOSIS — I10 Essential (primary) hypertension: Secondary | ICD-10-CM

## 2023-01-19 DIAGNOSIS — Z13228 Encounter for screening for other metabolic disorders: Secondary | ICD-10-CM

## 2023-01-19 DIAGNOSIS — Z13220 Encounter for screening for lipoid disorders: Secondary | ICD-10-CM

## 2023-01-19 DIAGNOSIS — Z0000 Encounter for general adult medical examination without abnormal findings: Secondary | ICD-10-CM | POA: Diagnosis not present

## 2023-01-19 DIAGNOSIS — Z136 Encounter for screening for cardiovascular disorders: Secondary | ICD-10-CM

## 2023-01-19 DIAGNOSIS — Z1159 Encounter for screening for other viral diseases: Secondary | ICD-10-CM | POA: Diagnosis not present

## 2023-01-19 MED ORDER — AMLODIPINE BESYLATE 10 MG PO TABS
10.0000 mg | ORAL_TABLET | Freq: Every day | ORAL | 0 refills | Status: DC
Start: 2023-01-19 — End: 2023-04-21

## 2023-01-19 NOTE — Progress Notes (Signed)
Complete physical exam  Patient: Jason Frank   DOB: 1985-06-07   37 y.o. Male  MRN: [id number]  Subjective:    Chief Complaint  Patient presents with   Annual Exam    Patient is here for his annual physical and he is fasting    Jason Frank is a 37 y.o. male who presents today for a complete physical exam. He reports consuming a general diet.  walks  He generally feels fairly well. He reports sleeping poorly. He does not have additional problems to discuss today.    Most recent fall risk assessment:     No data to display           Most recent depression screenings:     No data to display          Vision:Within last year and Dental: No current dental problems and Receives regular dental care  Past Medical History:  Diagnosis Date   Hypertension       Patient Care Team: Novella Olive, FNP as PCP - General (Family Medicine) Monica Becton, MD as Consulting Physician (Sports Medicine)   Outpatient Medications Prior to Visit  Medication Sig   HYDROcodone-acetaminophen (NORCO/VICODIN) 5-325 MG tablet Take 1 tablet by mouth every 8 (eight) hours as needed.   meloxicam (MOBIC) 15 MG tablet One tab PO qAM with a meal for 2 weeks, then daily prn pain.   predniSONE (DELTASONE) 20 MG tablet Take 2 tablets by mouth daily.   [DISCONTINUED] amLODipine (NORVASC) 10 MG tablet Take 1 tablet (10 mg total) by mouth daily.   No facility-administered medications prior to visit.    ROS        Objective:     BP 115/77   Pulse 62   Temp 98.9 F (37.2 C) (Oral)   Resp 18   Ht 5\' 7"  (1.702 m)   Wt 200 lb 3.2 oz (90.8 kg)   SpO2 98%   BMI 31.36 kg/m  BP Readings from Last 3 Encounters:  01/19/23 115/77  12/08/22 125/72  11/17/22 (!) 153/103      Physical Exam Vitals and nursing note reviewed.  Constitutional:      General: He is not in acute distress.    Appearance: Normal appearance.  HENT:     Right Ear: Tympanic membrane normal.      Left Ear: Tympanic membrane normal.     Nose: Nose normal.     Mouth/Throat:     Mouth: Mucous membranes are moist.     Pharynx: Oropharynx is clear.  Eyes:     Extraocular Movements: Extraocular movements intact.  Neck:     Thyroid: No thyroid tenderness.  Cardiovascular:     Rate and Rhythm: Normal rate and regular rhythm.     Pulses:          Radial pulses are 2+ on the right side and 2+ on the left side.     Heart sounds: Normal heart sounds, S1 normal and S2 normal.  Pulmonary:     Effort: Pulmonary effort is normal.     Breath sounds: Normal breath sounds.  Abdominal:     General: Bowel sounds are normal.     Palpations: Abdomen is soft.     Tenderness: There is no abdominal tenderness.  Musculoskeletal:        General: Normal range of motion.     Cervical back: Normal range of motion.     Right lower leg: No edema.  Left lower leg: No edema.  Lymphadenopathy:     Cervical:     Right cervical: No superficial cervical adenopathy.    Left cervical: No superficial cervical adenopathy.  Skin:    General: Skin is warm and dry.  Neurological:     General: No focal deficit present.     Mental Status: He is alert. Mental status is at baseline.  Psychiatric:        Mood and Affect: Mood normal.        Behavior: Behavior normal.        Thought Content: Thought content normal.        Judgment: Judgment normal.     No results found for any visits on 01/19/23.     Assessment & Plan:    Routine Health Maintenance and Physical Exam  Immunization History  Administered Date(s) Administered   Tdap 11/17/2022    Health Maintenance  Topic Date Due   HIV Screening  Never done   Hepatitis C Screening  Never done   COVID-19 Vaccine (1 - 2023-24 season) Never done   INFLUENZA VACCINE  06/21/2023 (Originally 10/22/2022)   DTaP/Tdap/Td (2 - Td or Tdap) 11/16/2032   HPV VACCINES  Aged Out    Discussed health benefits of physical activity, and encouraged him to engage in  regular exercise appropriate for his age and condition.  Annual physical exam -     CBC with Differential/Platelet -     Comprehensive metabolic panel -     Hemoglobin A1c -     Hepatitis C antibody -     HIV Antibody (routine testing w rflx) -     Lipid panel -     TSH + free T4  Essential hypertension -     Comprehensive metabolic panel -     amLODIPine Besylate; Take 1 tablet (10 mg total) by mouth daily.  Dispense: 90 tablet; Refill: 0  Encounter for lipid screening for cardiovascular disease -     Lipid panel  Encounter for screening for metabolic disorder -     Hemoglobin A1c -     TSH + free T4  Need for hepatitis C screening test -     Hepatitis C antibody  Screening for viral disease -     HIV Antibody (routine testing w rflx)  Screening for thyroid disorder  BMI 31.0-31.9,adult -     Hemoglobin A1c -     TSH + free T4      Routine labs ordered.  HCM reviewed/discussed. Hep C and HIV ordered.  Anticipatory guidance regarding healthy weight, lifestyle and choices given. Recommend healthy diet.  Recommend approximately 150 minutes/week of moderate intensity exercise. Resistance training is good for building muscles and for bone health. Muscle mass helps to increase our metabolism and to burn more calories at rest.  Limit alcohol consumption: no more than one drink per day for women and 2 drinks per day for me. Recommend regular dental and vision exams. Always use seatbelt/lap and shoulder restraints. Recommend using smoke alarms and checking batteries at least twice a year. Recommend using sunscreen when outside.  Please know that I am here to help you with all of your health care goals and am happy to work with you to find a solution that works best for you.  The greatest advice I have received with any changes in life are to take it one step at a time, that even means if all you can focus on is the next  60 seconds, then do that and celebrate your victories.   With any changes in life, you will have set backs, and that is OK. The important thing to remember is, if you have a set back, it is not a failure, it is an opportunity to try again! Agrees with plan of care discussed.  Questions answered.      Return in about 3 months (around 04/21/2023) for HTN.     Novella Olive, FNP

## 2023-01-20 LAB — COMPREHENSIVE METABOLIC PANEL
ALT: 31 [IU]/L (ref 0–44)
AST: 27 [IU]/L (ref 0–40)
Albumin: 4.6 g/dL (ref 4.1–5.1)
Alkaline Phosphatase: 88 [IU]/L (ref 44–121)
BUN/Creatinine Ratio: 9 (ref 9–20)
BUN: 10 mg/dL (ref 6–20)
Bilirubin Total: 0.4 mg/dL (ref 0.0–1.2)
CO2: 25 mmol/L (ref 20–29)
Calcium: 9.5 mg/dL (ref 8.7–10.2)
Chloride: 103 mmol/L (ref 96–106)
Creatinine, Ser: 1.08 mg/dL (ref 0.76–1.27)
Globulin, Total: 2.3 g/dL (ref 1.5–4.5)
Glucose: 97 mg/dL (ref 70–99)
Potassium: 4.2 mmol/L (ref 3.5–5.2)
Sodium: 142 mmol/L (ref 134–144)
Total Protein: 6.9 g/dL (ref 6.0–8.5)
eGFR: 90 mL/min/{1.73_m2} (ref >59)

## 2023-01-20 LAB — CBC WITH DIFFERENTIAL/PLATELET
Basophils Absolute: 0.1 10*3/uL (ref 0.0–0.2)
Basos: 1 %
EOS (ABSOLUTE): 0.2 10*3/uL (ref 0.0–0.4)
Eos: 3 %
Hematocrit: 45.1 % (ref 37.5–51.0)
Hemoglobin: 14.9 g/dL (ref 13.0–17.7)
Immature Grans (Abs): 0 10*3/uL (ref 0.0–0.1)
Immature Granulocytes: 0 %
Lymphocytes Absolute: 2.1 10*3/uL (ref 0.7–3.1)
Lymphs: 31 %
MCH: 29 pg (ref 26.6–33.0)
MCHC: 33 g/dL (ref 31.5–35.7)
MCV: 88 fL (ref 79–97)
Monocytes Absolute: 0.7 10*3/uL (ref 0.1–0.9)
Monocytes: 10 %
Neutrophils Absolute: 3.8 10*3/uL (ref 1.4–7.0)
Neutrophils: 55 %
Platelets: 242 10*3/uL (ref 150–450)
RBC: 5.13 x10E6/uL (ref 4.14–5.80)
RDW: 13.1 % (ref 11.6–15.4)
WBC: 6.8 10*3/uL (ref 3.4–10.8)

## 2023-01-20 LAB — HIV ANTIBODY (ROUTINE TESTING W REFLEX): HIV Screen 4th Generation wRfx: NONREACTIVE

## 2023-01-20 LAB — LIPID PANEL
Chol/HDL Ratio: 5.2 ratio — ABNORMAL HIGH (ref 0.0–5.0)
Cholesterol, Total: 192 mg/dL (ref 100–199)
HDL: 37 mg/dL — ABNORMAL LOW (ref >39)
LDL Chol Calc (NIH): 125 mg/dL — ABNORMAL HIGH (ref 0–99)
Triglycerides: 167 mg/dL — ABNORMAL HIGH (ref 0–149)
VLDL Cholesterol Cal: 30 mg/dL (ref 5–40)

## 2023-01-20 LAB — HEMOGLOBIN A1C
Est. average glucose Bld gHb Est-mCnc: 117 mg/dL
Hgb A1c MFr Bld: 5.7 % — ABNORMAL HIGH (ref 4.8–5.6)

## 2023-01-20 LAB — TSH+FREE T4
Free T4: 1.39 ng/dL (ref 0.82–1.77)
TSH: 1.08 u[IU]/mL (ref 0.450–4.500)

## 2023-01-20 LAB — HEPATITIS C ANTIBODY: Hep C Virus Ab: NONREACTIVE

## 2023-03-07 ENCOUNTER — Emergency Department (HOSPITAL_COMMUNITY)
Admission: EM | Admit: 2023-03-07 | Discharge: 2023-03-07 | Disposition: A | Discharge status: Home / Self Care | Payer: BC Managed Care – PPO | Attending: Emergency Medicine | Admitting: Emergency Medicine

## 2023-03-07 ENCOUNTER — Emergency Department (HOSPITAL_COMMUNITY): Payer: BC Managed Care – PPO

## 2023-03-07 ENCOUNTER — Other Ambulatory Visit: Payer: Self-pay

## 2023-03-07 ENCOUNTER — Encounter (HOSPITAL_COMMUNITY): Payer: Self-pay

## 2023-03-07 DIAGNOSIS — D72829 Elevated white blood cell count, unspecified: Secondary | ICD-10-CM | POA: Insufficient documentation

## 2023-03-07 DIAGNOSIS — N23 Unspecified renal colic: Secondary | ICD-10-CM | POA: Insufficient documentation

## 2023-03-07 DIAGNOSIS — R739 Hyperglycemia, unspecified: Secondary | ICD-10-CM | POA: Diagnosis not present

## 2023-03-07 DIAGNOSIS — N134 Hydroureter: Secondary | ICD-10-CM | POA: Diagnosis not present

## 2023-03-07 DIAGNOSIS — N433 Hydrocele, unspecified: Secondary | ICD-10-CM | POA: Diagnosis not present

## 2023-03-07 DIAGNOSIS — Z720 Tobacco use: Secondary | ICD-10-CM | POA: Insufficient documentation

## 2023-03-07 DIAGNOSIS — N132 Hydronephrosis with renal and ureteral calculous obstruction: Secondary | ICD-10-CM | POA: Diagnosis not present

## 2023-03-07 DIAGNOSIS — K4020 Bilateral inguinal hernia, without obstruction or gangrene, not specified as recurrent: Secondary | ICD-10-CM | POA: Diagnosis not present

## 2023-03-07 DIAGNOSIS — R109 Unspecified abdominal pain: Secondary | ICD-10-CM | POA: Diagnosis not present

## 2023-03-07 DIAGNOSIS — N50819 Testicular pain, unspecified: Secondary | ICD-10-CM | POA: Diagnosis not present

## 2023-03-07 LAB — BASIC METABOLIC PANEL
Anion gap: 11 (ref 5–15)
BUN: 14 mg/dL (ref 6–20)
CO2: 26 mmol/L (ref 22–32)
Calcium: 9.8 mg/dL (ref 8.9–10.3)
Chloride: 101 mmol/L (ref 98–111)
Creatinine, Ser: 1.19 mg/dL (ref 0.61–1.24)
GFR, Estimated: >60 mL/min (ref >60)
Glucose, Bld: 122 mg/dL — ABNORMAL HIGH (ref 70–99)
Potassium: 3.8 mmol/L (ref 3.5–5.1)
Sodium: 138 mmol/L (ref 135–145)

## 2023-03-07 LAB — URINALYSIS, W/ REFLEX TO CULTURE (INFECTION SUSPECTED)
Bacteria, UA: NONE SEEN
Bilirubin Urine: NEGATIVE
Glucose, UA: NEGATIVE mg/dL
Ketones, ur: NEGATIVE mg/dL
Leukocytes,Ua: NEGATIVE
Nitrite: NEGATIVE
Protein, ur: 30 mg/dL — AB
RBC / HPF: >50 RBC/hpf (ref 0–5)
Specific Gravity, Urine: 1.014 (ref 1.005–1.030)
pH: 8 (ref 5.0–8.0)

## 2023-03-07 LAB — CBC WITH DIFFERENTIAL/PLATELET
Abs Immature Granulocytes: 0.03 10*3/uL (ref 0.00–0.07)
Basophils Absolute: 0 10*3/uL (ref 0.0–0.1)
Basophils Relative: 0 %
Eosinophils Absolute: 0.1 10*3/uL (ref 0.0–0.5)
Eosinophils Relative: 1 %
HCT: 46.2 % (ref 39.0–52.0)
Hemoglobin: 15.5 g/dL (ref 13.0–17.0)
Immature Granulocytes: 0 %
Lymphocytes Relative: 14 %
Lymphs Abs: 1.7 10*3/uL (ref 0.7–4.0)
MCH: 28.8 pg (ref 26.0–34.0)
MCHC: 33.5 g/dL (ref 30.0–36.0)
MCV: 85.9 fL (ref 80.0–100.0)
Monocytes Absolute: 0.7 10*3/uL (ref 0.1–1.0)
Monocytes Relative: 6 %
Neutro Abs: 9.2 10*3/uL — ABNORMAL HIGH (ref 1.7–7.7)
Neutrophils Relative %: 79 %
Platelets: 262 10*3/uL (ref 150–400)
RBC: 5.38 MIL/uL (ref 4.22–5.81)
RDW: 13 % (ref 11.5–15.5)
WBC: 11.7 10*3/uL — ABNORMAL HIGH (ref 4.0–10.5)
nRBC: 0 % (ref 0.0–0.2)

## 2023-03-07 MED ORDER — MORPHINE SULFATE (PF) 4 MG/ML IV SOLN
4.0000 mg | Freq: Once | INTRAVENOUS | Status: AC
  Administered 2023-03-07: 4 mg via INTRAVENOUS
  Filled 2023-03-07: qty 1

## 2023-03-07 MED ORDER — KETOROLAC TROMETHAMINE 30 MG/ML IJ SOLN
30.0000 mg | Freq: Once | INTRAMUSCULAR | Status: AC
  Administered 2023-03-07: 30 mg via INTRAVENOUS
  Filled 2023-03-07: qty 1

## 2023-03-07 MED ORDER — OXYCODONE-ACETAMINOPHEN 5-325 MG PO TABS: 1.0000 | ORAL_TABLET | Freq: Four times a day (QID) | ORAL | 0 refills | Status: AC | PRN

## 2023-03-07 MED ORDER — ONDANSETRON HCL 4 MG/2ML IJ SOLN
4.0000 mg | Freq: Once | INTRAMUSCULAR | Status: AC
  Administered 2023-03-07: 4 mg via INTRAVENOUS
  Filled 2023-03-07: qty 2

## 2023-03-07 NOTE — ED Notes (Signed)
Ct completed.

## 2023-03-07 NOTE — Discharge Instructions (Addendum)
Thank you for coming to [facility] Emergency Department. You were diagnosed with a kidney stone. Please follow up with [facility] as needed. You can alternate taking Tylenol and ibuprofen as needed for pain. We have prescribed percocet to take as needed every 6 hours for severe pain.  Do not hesitate to return to the ED or call 911 if you experience: -Worsening symptoms -Inability to urinate -Nausea/vomiting so severe you cannot eat/drink anything -Lightheadedness, passing out -Fevers/chills -Anything else that concerns you

## 2023-03-07 NOTE — ED Notes (Signed)
US at bedside

## 2023-03-07 NOTE — ED Provider Notes (Signed)
[place] EMERGENCY DEPARTMENT AT [facility] Provider Note   CSN: [id number] Arrival date & time: 03/07/23  1311     History  Chief Complaint  Patient presents with   Testicle Pain    Jason Frank is a 37 y.o. male.  He is complaining of acute testicular and groin pain after urinating this morning.  He did not notice anything unusual about the urine.  Since then the pain has been unrelenting and is associated with nausea.  He rates it as 10 out of 10.  He said he is having difficulty passing much urine now.  No fevers or chills.  No trauma.  No discharge or sores that he is noted.  The history is provided by the patient.  Testicle Pain This is a new problem. The current episode started 6 to 12 hours ago. The problem occurs constantly. The problem has not changed since onset.Associated symptoms include abdominal pain. Pertinent negatives include no chest pain, no headaches and no shortness of breath. Nothing aggravates the symptoms. Nothing relieves the symptoms. He has tried rest for the symptoms. The treatment provided no relief.       Home Medications Prior to Admission medications   Medication Sig Start Date End Date Taking? Authorizing Provider  amLODipine (NORVASC) 10 MG tablet Take 1 tablet (10 mg total) by mouth daily. 01/19/23   Novella Olive, FNP  HYDROcodone-acetaminophen (NORCO/VICODIN) 5-325 MG tablet Take 1 tablet by mouth every 8 (eight) hours as needed.    [provider]  meloxicam (MOBIC) 15 MG tablet One tab PO qAM with a meal for 2 weeks, then daily prn pain. 06/13/19   Monica Becton, MD  predniSONE (DELTASONE) 20 MG tablet Take 2 tablets by mouth daily.    [provider]      Allergies    Sulfa antibiotics and Sulfur dioxide    Review of Systems   Review of Systems  Constitutional:  Negative for fever.  Respiratory:  Negative for shortness of breath.   Cardiovascular:  Negative for chest pain.   Gastrointestinal:  Positive for abdominal pain and nausea. Negative for vomiting.  Genitourinary:  Positive for difficulty urinating and testicular pain. Negative for genital sores, hematuria and penile discharge.  Neurological:  Negative for headaches.    Physical Exam Updated Vital Signs BP (!) 143/90 (BP Location: Left Arm)   Pulse 99   Temp 98.1 F (36.7 C) (Oral)   Resp 16   Ht 5\' 7"  (1.702 m)   Wt 88.5 kg   SpO2 96%   BMI 30.54 kg/m  Physical Exam Vitals and nursing note reviewed.  Constitutional:      General: He is in acute distress.     Appearance: Normal appearance. He is well-developed.  HENT:     Head: Normocephalic and atraumatic.  Eyes:     Conjunctiva/sclera: Conjunctivae normal.  Cardiovascular:     Rate and Rhythm: Normal rate and regular rhythm.     Heart sounds: No murmur heard. Pulmonary:     Effort: Pulmonary effort is normal. No respiratory distress.     Breath sounds: Normal breath sounds.  Abdominal:     Palpations: Abdomen is soft.     Tenderness: There is no abdominal tenderness. There is no guarding or rebound.  Genitourinary:    Penis: Normal.      Testes: Normal.  Musculoskeletal:        General: No deformity. Normal range of motion.     Cervical  back: Neck supple.  Skin:    General: Skin is warm and dry.     Capillary Refill: Capillary refill takes less than 2 seconds.  Neurological:     General: No focal deficit present.     Mental Status: He is alert.     ED Results / Procedures / Treatments   Labs (all labs ordered are listed, but only abnormal results are displayed) Labs Reviewed  BASIC METABOLIC PANEL - Abnormal; Notable for the following components:      Result Value   Glucose, Bld 122 (*)    All other components within normal limits  CBC WITH DIFFERENTIAL/PLATELET - Abnormal; Notable for the following components:   WBC 11.7 (*)    Neutro Abs 9.2 (*)    All other components within normal limits  URINALYSIS, W/ REFLEX  TO CULTURE (INFECTION SUSPECTED)  GC/CHLAMYDIA PROBE AMP ([place]) NOT AT [facility]    EKG None  Radiology CT Renal Stone Study Result Date: 03/07/2023 CLINICAL DATA:  Flank pain. EXAM: CT ABDOMEN AND PELVIS WITHOUT CONTRAST TECHNIQUE: Multidetector CT imaging of the abdomen and pelvis was performed following the standard protocol without IV contrast. RADIATION DOSE REDUCTION: This exam was performed according to the departmental dose-optimization program which includes automated exposure control, adjustment of the mA and/or kV according to patient size and/or use of iterative reconstruction technique. COMPARISON:  None Available. FINDINGS: Lower chest: Lung bases clear.  No pleural or pericardial effusion. Hepatobiliary: No focal liver abnormality is seen. No gallstones, gallbladder wall thickening, or biliary dilatation. Pancreas: Unremarkable. No pancreatic ductal dilatation or surrounding inflammatory changes. Spleen: Normal in size without focal abnormality. Adrenals/Urinary Tract: No adrenal lesions. No renal parenchymal abnormalities. Left-sided hydronephrosis and hydroureter with a 1 mm stone at the left UVJ. Stomach/Bowel: Stomach is within normal limits. Appendix appears normal. No evidence of bowel wall thickening, distention, or inflammatory changes. Vascular/Lymphatic: No significant vascular findings are present. No enlarged abdominal or pelvic lymph nodes. Reproductive: Prostate is unremarkable. Other: Small bilateral fat containing inguinal hernias. No abdominopelvic ascites. Musculoskeletal: No acute or significant osseous findings. IMPRESSION: 1 mm left UVJ stone with mild obstructive uropathy. Electronically Signed   By: Layla Maw M.D.   On: 03/07/2023 14:38    Procedures Procedures    Medications Ordered in ED Medications  ondansetron (ZOFRAN) injection 4 mg (4 mg Intravenous Given 03/07/23 1412)  morphine (PF) 4 MG/ML injection 4 mg (4 mg Intravenous Given 03/07/23  1412)  ketorolac (TORADOL) 30 MG/ML injection 30 mg (30 mg Intravenous Given 03/07/23 1412)    ED Course/ Medical Decision Making/ A&P Clinical Course as of 03/07/23 1706  Sun Mar 07, 2023  1458 Patient is much more comfortable and his pain level.  CT showing 1 mm UVJ stone on the left likely accounting for his symptoms. [MB]    Clinical Course User Index [MB] Terrilee Files, MD                                 Medical Decision Making Amount and/or Complexity of Data Reviewed Labs: ordered. Radiology: ordered.  Risk Prescription drug management.   This patient complains of bilateral testicular pain and difficulty urinating; this involves an extensive number of treatment Options and is a complaint that carries with it a high risk of complications and morbidity. The differential includes UTI, torsion, epididymitis, kidney stone  I ordered, reviewed and interpreted labs, which included CBC with elevated  white count, chemistry with elevated glucose I ordered medication IV pain medicine and nausea medicine and reviewed PMP when indicated. I ordered imaging studies which included CT renal and scrotal ultrasound and I independently    visualized and interpreted imaging which showed 1 mm UVJ stone on left Additional history obtained from patient's wife Previous records obtained and reviewed in epic no recent admissions Cardiac monitoring reviewed, sinus tachycardia Social determinants considered, tobacco use Critical Interventions: None  After the interventions stated above, I reevaluated the patient and found patient to be much more comfortable after pain medication Admission and further testing considered, his care is signed out to Dr. Jearld Fenton to follow-up readings of scrotal ultrasound.  If negative patient can be likely discharged with prescriptions for pain medication and outpatient urology follow-up.         Final Clinical Impression(s) / ED Diagnoses Final diagnoses:   Ureteral colic    Rx / DC Orders ED Discharge Orders          Ordered    oxyCODONE-acetaminophen (PERCOCET/ROXICET) 5-325 MG tablet  Every 6 hours PRN        03/07/23 1522    oxyCODONE-acetaminophen (PERCOCET/ROXICET) 5-325 MG tablet  Every 6 hours PRN        03/07/23 1524              Terrilee Files, MD 03/07/23 1708

## 2023-03-07 NOTE — ED Triage Notes (Signed)
Pt reports lower abd pain and left side testicle pain with difficulty urinating, onset this morning.

## 2023-03-07 NOTE — ED Provider Notes (Signed)
3:05 PM Assumed care of patient from off-going team. For more details, please see note from same day.  In brief, this is a 37 y.o. male who presented with testicular pain with urinating this AM. W/u with 1mm distal ureterolithiasis.   Plan/Dispo at time of sign-out & ED Course since sign-out: [ ]  testicular US, UA  BP (!) 143/90 (BP Location: Left Arm)   Pulse 99   Temp 98.1 F (36.7 C) (Oral)   Resp 16   Ht 5\' 7"  (1.702 m)   Wt 88.5 kg   SpO2 96%   BMI 30.54 kg/m    ED Course:   Clinical Course as of 03/07/23 2014  Sun Mar 07, 2023  1458 Patient is much more comfortable and his pain level.  CT showing 1 mm UVJ stone on the left likely accounting for his symptoms. [MB]  1722 US SCROTUM W/DOPPLER Negative for testicular torsion or mass. Trace bilateral hydroceles. [HN]  1805 Urinalysis, w/ Reflex to Culture (Infection Suspected) -Urine, Clean Catch(!) Mild proteinuria and hematuria, likely d/t renal stone. Will be DC'd w/  [HN]    Clinical Course User Index [HN] Loetta Rough, MD [MB] Terrilee Files, MD    Dispo: [facility] w/ rx for percocet and f/u with urology as needed. DC w/ discharge instructions/return precautions. ------------------------------- Vivi Barrack, MD Emergency Medicine  This note was created using dictation software, which may contain spelling or grammatical errors.   Loetta Rough, MD 03/07/23 2014

## 2023-03-08 MED FILL — Oxycodone w/ Acetaminophen Tab 5-325 MG: ORAL | Qty: 6 | Status: AC

## 2023-03-09 LAB — GC/CHLAMYDIA PROBE AMP (~~LOC~~) NOT AT ARMC
Chlamydia: NEGATIVE
Comment: NEGATIVE
Comment: NORMAL
Neisseria Gonorrhea: NEGATIVE

## 2023-04-08 DIAGNOSIS — N201 Calculus of ureter: Secondary | ICD-10-CM | POA: Diagnosis not present

## 2023-04-21 ENCOUNTER — Ambulatory Visit: Payer: BC Managed Care – PPO | Admitting: Family Medicine

## 2023-04-21 ENCOUNTER — Encounter: Payer: Self-pay | Admitting: Family Medicine

## 2023-04-21 VITALS — BP 118/84 | HR 105 | Temp 98.9°F | Resp 18 | Ht 67.0 in | Wt 204.1 lb

## 2023-04-21 DIAGNOSIS — Z87442 Personal history of urinary calculi: Secondary | ICD-10-CM | POA: Diagnosis not present

## 2023-04-21 DIAGNOSIS — I10 Essential (primary) hypertension: Secondary | ICD-10-CM

## 2023-04-21 MED ORDER — AMLODIPINE BESYLATE 10 MG PO TABS: 10.0000 mg | ORAL_TABLET | Freq: Every day | ORAL | 1 refills | Status: DC

## 2023-04-21 NOTE — Assessment & Plan Note (Signed)
Passed 1 mm stone in December. Saw urology. Requested information concerning diet to follow to avoid future stones. Information provided on AVS.

## 2023-04-21 NOTE — Patient Instructions (Signed)
Healthy Heart:  Recommend heart healthy/Mediterranean diet, with whole grains, fruits, vegetable, fish, lean meats, nuts, and olive oil. Limit salt. Recommend moderate walking, 3-5 times/week for 30-50 minutes each session. Aim for at least 150 minutes.week. Goal should be pace of 3 miles/hours, or walking 1.5 miles in 30 minutes Recommend avoidance of tobacco products. Avoid excess alcohol.

## 2023-04-21 NOTE — Assessment & Plan Note (Signed)
Taking amlodipine 10 mg daily as prescribed.  Denies chest pain, shortness of breath, lower extremity edema, vision changes, headaches. Well  controlled in office today. No changes in regimen.  DASH diet, moderate exercise. Continue to monitor BP at home. Follow-up in 6 months, sooner if anything changes.

## 2023-04-21 NOTE — Progress Notes (Signed)
Established Patient Office Visit  Subjective   Patient ID: Jason Frank, male    DOB: 1985/04/30  Age: 38 y.o. MRN: [id number]  Chief Complaint  Patient presents with   Medical Management of Chronic Issues    Patient is here for a 3 month follow up for HTN    HPI  Hypertension Medication compliance: Taking amlodipine 10 mg daily as prescribed.  Denies chest pain, shortness of breath, lower extremity edema, vision changes, headaches.  Pertinent lab work: 03/07/23: BMP K+ 3.8, GFR > 60 Monitoring at home: 130/80 Tolerating medication well: no side effects  Continue current medication regimen: no changes.  Follow-up: 6 months   Chart review:  03/07/23: kidney stone, passed. 1 mm left UVJ stone with mild obstructive uropathy.  Went to urology.  Wants to know what to drink to "flush kidneys"     Review of Systems  Eyes:  Negative for blurred vision and double vision.  Respiratory:  Negative for shortness of breath.   Cardiovascular:  Negative for chest pain and leg swelling.  Neurological:  Negative for headaches.      Objective:     BP 118/84 (BP Location: Right Arm, Patient Position: Sitting, Cuff Size: Normal)   Pulse (!) 105   Temp 98.9 F (37.2 C) (Oral)   Resp 18   Ht 5\' 7"  (1.702 m)   Wt 204 lb 1.6 oz (92.6 kg)   SpO2 96%   BMI 31.97 kg/m  BP Readings from Last 3 Encounters:  04/21/23 118/84  03/07/23 (!) 143/90  01/19/23 115/77      Physical Exam Vitals and nursing note reviewed.  Constitutional:      General: He is not in acute distress.    Appearance: Normal appearance.  Cardiovascular:     Rate and Rhythm: Normal rate and regular rhythm.     Heart sounds: Normal heart sounds.  Pulmonary:     Effort: Pulmonary effort is normal.     Breath sounds: Normal breath sounds.  Skin:    General: Skin is warm and dry.  Neurological:     General: No focal deficit present.     Mental Status: He is alert. Mental status is at baseline.   Psychiatric:        Mood and Affect: Mood normal.        Behavior: Behavior normal.        Thought Content: Thought content normal.        Judgment: Judgment normal.     No results found for any visits on 04/21/23.  Last metabolic panel Lab Results  Component Value Date   GLUCOSE 122 (H) 03/07/2023   NA 138 03/07/2023   K 3.8 03/07/2023   CL 101 03/07/2023   CO2 26 03/07/2023   BUN 14 03/07/2023   CREATININE 1.19 03/07/2023   GFRNONAA >60 03/07/2023   CALCIUM 9.8 03/07/2023   PROT 6.9 01/19/2023   ALBUMIN 4.6 01/19/2023   LABGLOB 2.3 01/19/2023   BILITOT 0.4 01/19/2023   ALKPHOS 88 01/19/2023   AST 27 01/19/2023   ALT 31 01/19/2023   ANIONGAP 11 03/07/2023      The ASCVD Risk score (Arnett DK, et al., 2019) failed to calculate for the following reasons:   The 2019 ASCVD risk score is only valid for ages 9 to 32    Assessment & Plan:   Problem List Items Addressed This Visit     Essential hypertension   Taking amlodipine 10 mg daily as prescribed.  Denies chest pain, shortness of breath, lower extremity edema, vision changes, headaches. Well  controlled in office today. No changes in regimen.  DASH diet, moderate exercise. Continue to monitor BP at home. Follow-up in 6 months, sooner if anything changes.       Relevant Medications   amLODipine (NORVASC) 10 MG tablet   History of renal stone - Primary   Passed 1 mm stone in December. Saw urology. Requested information concerning diet to follow to avoid future stones. Information provided on AVS.      Agrees with plan of care discussed.  Questions answered.   Return in about 6 months (around 10/12/2023).    Novella Olive, FNP

## 2023-10-19 ENCOUNTER — Ambulatory Visit: Payer: BC Managed Care – PPO | Admitting: Family Medicine

## 2023-10-19 ENCOUNTER — Encounter: Payer: Self-pay | Admitting: Family Medicine

## 2023-10-19 VITALS — BP 133/81 | HR 86 | Temp 98.6°F | Resp 18 | Ht 67.0 in | Wt 188.0 lb

## 2023-10-19 DIAGNOSIS — I10 Essential (primary) hypertension: Secondary | ICD-10-CM | POA: Diagnosis not present

## 2023-10-19 DIAGNOSIS — R7303 Prediabetes: Secondary | ICD-10-CM | POA: Diagnosis not present

## 2023-10-19 MED ORDER — AMLODIPINE BESYLATE 10 MG PO TABS: 10.0000 mg | ORAL_TABLET | Freq: Every day | ORAL | 1 refills | Status: AC

## 2023-10-19 NOTE — Assessment & Plan Note (Signed)
 Not currently takiing medication. Last A1C 5.7. Recheck today. Avoid added sugars and processed carbohydrates.  A1C today.

## 2023-10-19 NOTE — Assessment & Plan Note (Addendum)
 Taking amlodipine  10 mg daily. Endorses missing some doses.  Encouraged not missing doses for best control.  Denies chest pain, shortness of breath, lower extremity edema, vision changes, headaches.  Well controlled in the office. Does not monitor BP regularly at home. BMP today. Refill sent. Follow-up in 6 months

## 2023-10-19 NOTE — Progress Notes (Signed)
   Established Patient Office Visit  Subjective   Patient ID: Jason Frank, male    DOB: January 08, 1986  Age: 38 y.o. MRN: [id number]  Chief Complaint  Patient presents with   Follow-up    HTN    HPI  Hypertension Medication compliance: Taking amlodipine  10 mg daily. Endorses missing some doses.  Denies chest pain, shortness of breath, lower extremity edema, vision changes, headaches.  Pertinent lab work: BMP today  Monitoring at home: occasionally  Tolerating medication well: no side effects Continue current medication regimen: no change Follow-up: 6 months    Review of Systems  Eyes:  Negative for blurred vision and double vision.  Respiratory:  Negative for shortness of breath.   Cardiovascular:  Negative for chest pain.  Neurological:  Negative for headaches.      Objective:     BP 133/81 (BP Location: Left Arm, Patient Position: Sitting, Cuff Size: Normal)   Pulse 86   Temp 98.6 F (37 C) (Oral)   Resp 18   Ht 5' 7 (1.702 m)   Wt 188 lb (85.3 kg)   SpO2 97%   BMI 29.44 kg/m    Physical Exam Vitals and nursing note reviewed.  Constitutional:      General: He is not in acute distress.    Appearance: Normal appearance.  Cardiovascular:     Rate and Rhythm: Normal rate and regular rhythm.     Heart sounds: Normal heart sounds.  Pulmonary:     Effort: Pulmonary effort is normal.     Breath sounds: Normal breath sounds.  Skin:    General: Skin is warm and dry.  Neurological:     General: No focal deficit present.     Mental Status: He is alert. Mental status is at baseline.  Psychiatric:        Mood and Affect: Mood normal.        Behavior: Behavior normal.        Thought Content: Thought content normal.        Judgment: Judgment normal.      No results found for any visits on 10/19/23.    The ASCVD Risk score (Arnett DK, et al., 2019) failed to calculate for the following reasons:   The 2019 ASCVD risk score is only valid for ages 66 to  45    Assessment & Plan:   Problem List Items Addressed This Visit     Essential hypertension - Primary   Taking amlodipine  10 mg daily. Endorses missing some doses.  Encouraged not missing doses for best control.  Denies chest pain, shortness of breath, lower extremity edema, vision changes, headaches.  Well controlled in the office. Does not monitor BP regularly at home. BMP today. Refill sent. Follow-up in 6 months      Relevant Medications   amLODipine  (NORVASC ) 10 MG tablet   Other Relevant Orders   Basic metabolic panel with GFR   Prediabetes   Not currently takiing medication. Last A1C 5.7. Recheck today. Avoid added sugars and processed carbohydrates.  A1C today.      Relevant Orders   Hemoglobin A1c  Agrees with plan of care discussed.  Questions answered.   Return in about 6 months (around 04/20/2024) for HTN, pre-diabetes.    Darice JONELLE Brownie, FNP

## 2024-01-13 DIAGNOSIS — R059 Cough, unspecified: Secondary | ICD-10-CM | POA: Diagnosis not present

## 2024-01-13 DIAGNOSIS — R0602 Shortness of breath: Secondary | ICD-10-CM | POA: Diagnosis not present

## 2024-01-13 DIAGNOSIS — R062 Wheezing: Secondary | ICD-10-CM | POA: Diagnosis not present

## 2024-04-20 ENCOUNTER — Ambulatory Visit: Admitting: Family Medicine
